# Patient Record
Sex: Female | Born: 1955 | Race: White | Hispanic: No | Smoking: Never smoker
Health system: Southern US, Community
[De-identification: ages and names within clinical notes are randomized; demographics above are authoritative.]

## PROBLEM LIST (undated history)

## (undated) DIAGNOSIS — F329 Major depressive disorder, single episode, unspecified: Secondary | ICD-10-CM

## (undated) DIAGNOSIS — I10 Essential (primary) hypertension: Secondary | ICD-10-CM

## (undated) DIAGNOSIS — F32A Depression, unspecified: Secondary | ICD-10-CM

## (undated) DIAGNOSIS — J45909 Unspecified asthma, uncomplicated: Secondary | ICD-10-CM

## (undated) DIAGNOSIS — E119 Type 2 diabetes mellitus without complications: Secondary | ICD-10-CM

## (undated) DIAGNOSIS — E785 Hyperlipidemia, unspecified: Secondary | ICD-10-CM

## (undated) HISTORY — PX: URETER SURGERY: SHX823

## (undated) HISTORY — DX: Unspecified asthma, uncomplicated: J45.909

## (undated) HISTORY — DX: Essential (primary) hypertension: I10

## (undated) HISTORY — PX: APPENDECTOMY: SHX54

## (undated) HISTORY — DX: Hyperlipidemia, unspecified: E78.5

## (undated) HISTORY — PX: OTHER SURGICAL HISTORY: SHX169

## (undated) HISTORY — DX: Major depressive disorder, single episode, unspecified: F32.9

## (undated) HISTORY — DX: Type 2 diabetes mellitus without complications: E11.9

## (undated) HISTORY — PX: NASAL SINUS SURGERY: SHX719

## (undated) HISTORY — DX: Depression, unspecified: F32.A

---

## 1998-05-01 ENCOUNTER — Emergency Department (HOSPITAL_COMMUNITY): Admission: EM | Admit: 1998-05-01 | Discharge: 1998-05-01 | Payer: Self-pay | Admitting: Emergency Medicine

## 1999-02-12 ENCOUNTER — Inpatient Hospital Stay (HOSPITAL_COMMUNITY): Admission: EM | Admit: 1999-02-12 | Discharge: 1999-02-21 | Payer: Self-pay | Admitting: Emergency Medicine

## 1999-03-09 ENCOUNTER — Other Ambulatory Visit: Admission: RE | Admit: 1999-03-09 | Discharge: 1999-03-09 | Payer: Self-pay | Admitting: Obstetrics and Gynecology

## 1999-05-28 ENCOUNTER — Other Ambulatory Visit: Admission: RE | Admit: 1999-05-28 | Discharge: 1999-06-05 | Payer: Self-pay

## 2001-01-14 ENCOUNTER — Inpatient Hospital Stay (HOSPITAL_COMMUNITY): Admission: RE | Admit: 2001-01-14 | Discharge: 2001-01-16 | Payer: Self-pay | Admitting: Obstetrics and Gynecology

## 2001-06-24 ENCOUNTER — Other Ambulatory Visit: Admission: RE | Admit: 2001-06-24 | Discharge: 2001-06-24 | Payer: Self-pay | Admitting: Obstetrics and Gynecology

## 2002-06-25 ENCOUNTER — Other Ambulatory Visit: Admission: RE | Admit: 2002-06-25 | Discharge: 2002-06-25 | Payer: Self-pay | Admitting: Obstetrics and Gynecology

## 2003-06-28 ENCOUNTER — Other Ambulatory Visit: Admission: RE | Admit: 2003-06-28 | Discharge: 2003-06-28 | Payer: Self-pay | Admitting: Obstetrics and Gynecology

## 2003-11-17 ENCOUNTER — Other Ambulatory Visit: Admission: RE | Admit: 2003-11-17 | Discharge: 2003-11-17 | Payer: Self-pay | Admitting: Obstetrics and Gynecology

## 2004-04-26 ENCOUNTER — Encounter: Admission: RE | Admit: 2004-04-26 | Discharge: 2004-04-26 | Payer: Self-pay | Admitting: Ophthalmology

## 2004-04-26 IMAGING — CT CT HEAD WO/W CM
4 of 6 series · 16 of 30 positions shown, 17 images · IV contrast (OMNIPAQUE [ID])
Comparison: none

CLINICAL DATA: Eye pain.  
CT HEAD WITHOUT and with CONTRAST
No previous for comparison.  
Cranial CT was performed before and after administration of  75 cc Omnipaque 300 intravenous contrast.  
There is no evidence of enhancing lesions, brain edema, mass effect or intracranial hemorrhage.  The ventricles are normal.  No extra-axial abnormalities are identified.  Bone windows show no significant abnormality. 
IMPRESSION
Negative cranial CT.    
CT ORBITS
Direct axial and coronal scanning was performed after 75 ml Omnipaque 300 IV.

[Series 2: brain · axial · 0.49mm/px · z∈[+46,+165]mm · 3 of 24 slices shown, 4 images]
[im 1/24  brain]
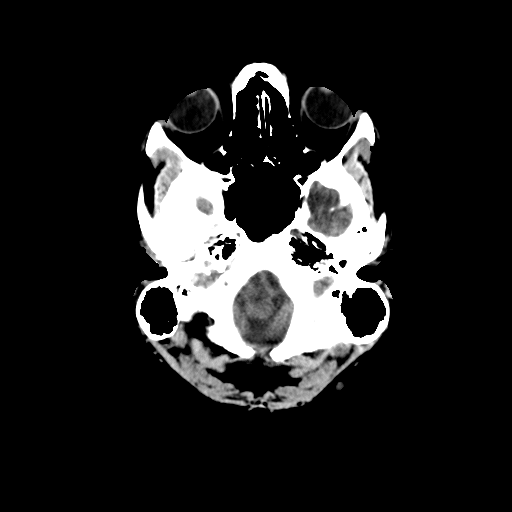
[im 1/24  bone]
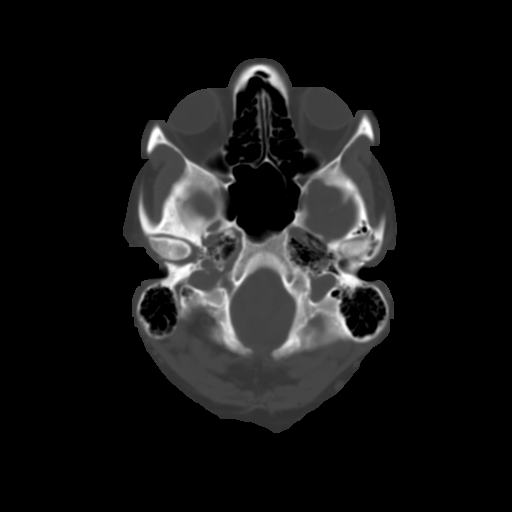
[im 12/24  brain]
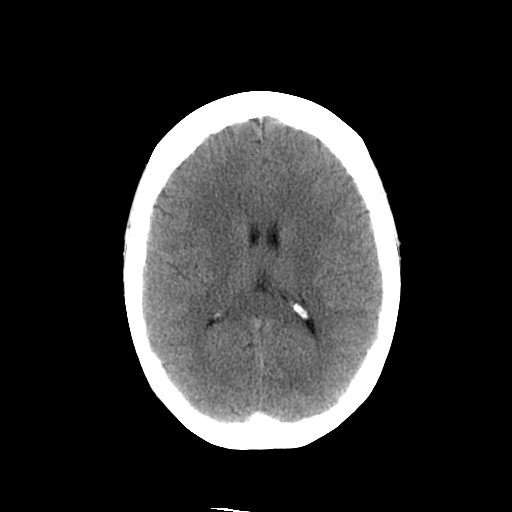
[im 24/24  brain]
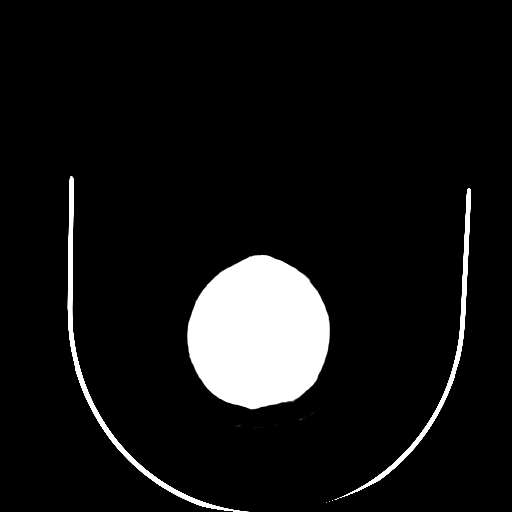

[Series 4: orbits/facial prone · axial · 0.33mm/px · z∈[+47,+87]mm · 3 of 32 slices shown]
[im 8/32  brain]
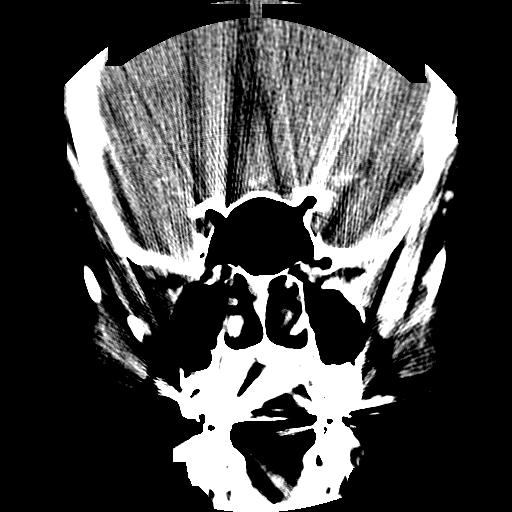
[im 16/32  brain]
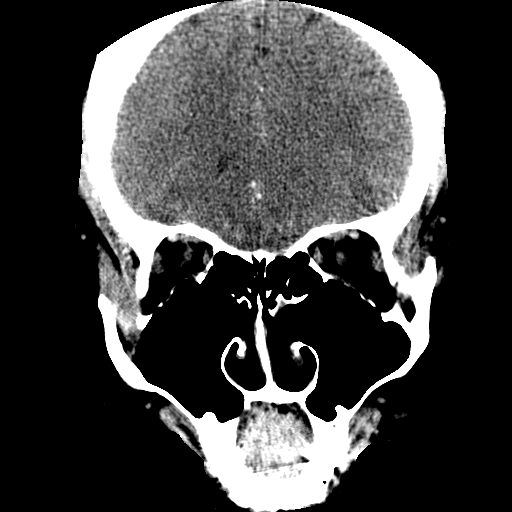
[im 24/32  brain]
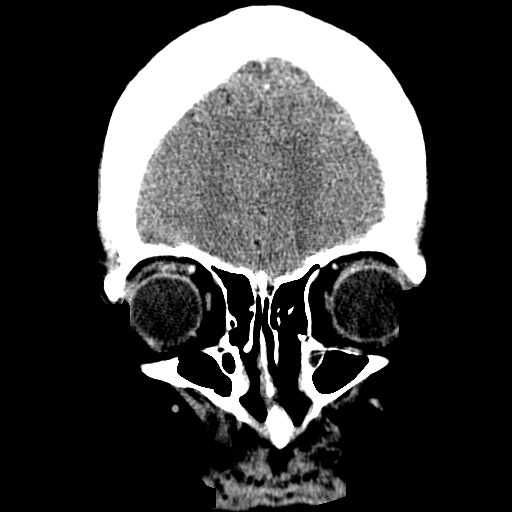

[Series 7: — · axial · 0.33mm/px · z∈[-15,+25]mm · 5 of 50 slices shown]
[im 9/50  brain]
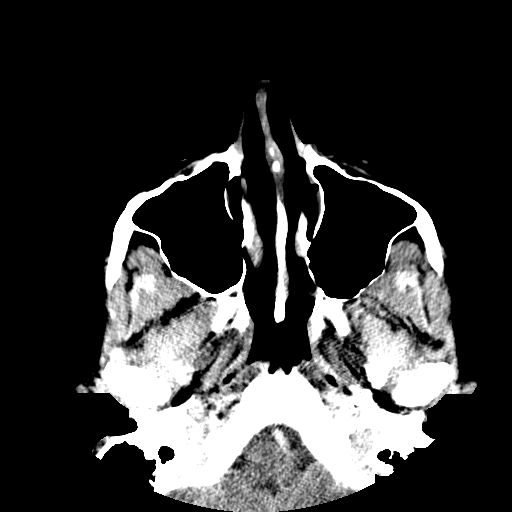
[im 17/50  brain]
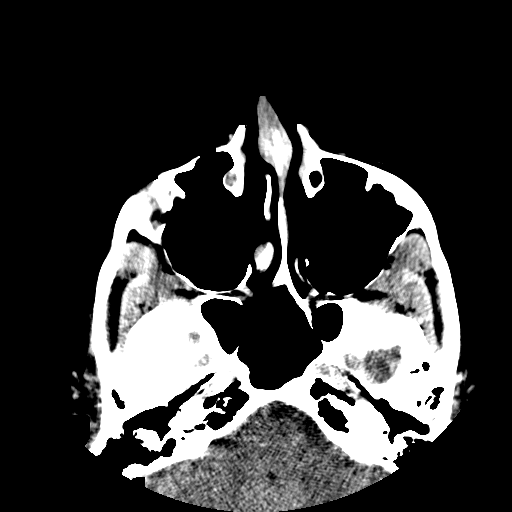
[im 25/50  brain]
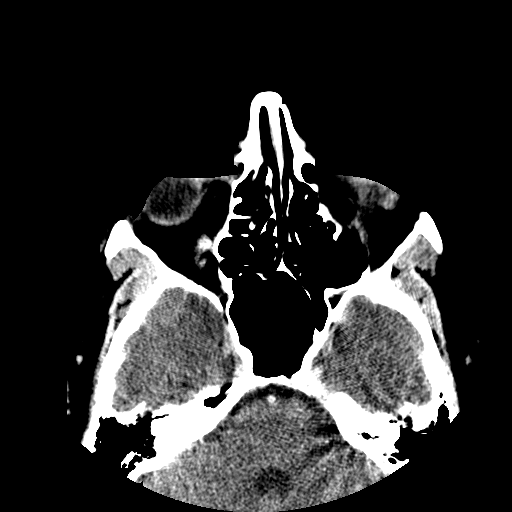
[im 33/50  brain]
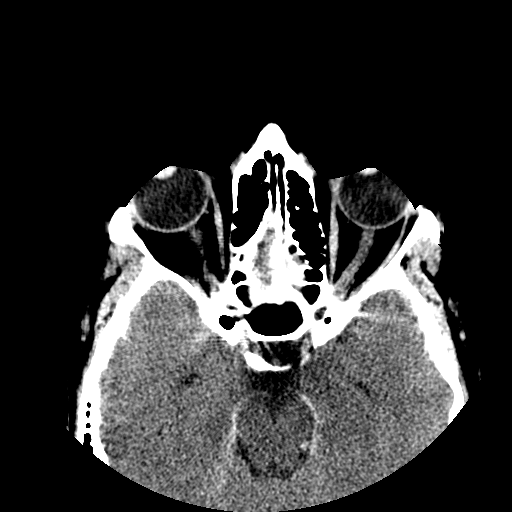
[im 41/50  brain]
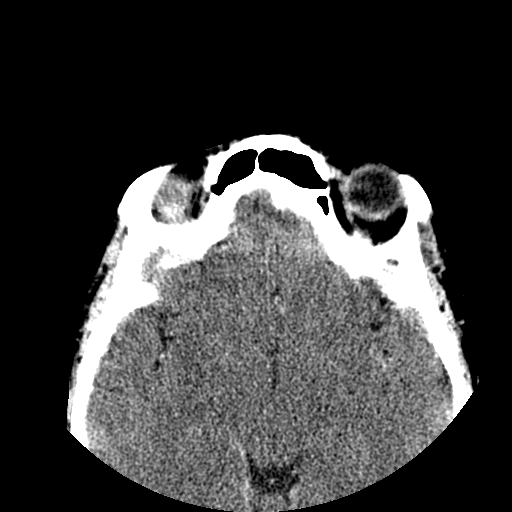

[Series 8: recon 2: · axial · 0.33mm/px · z∈[-15,+25]mm · 5 of 50 slices shown]
[im 9/50  brain]
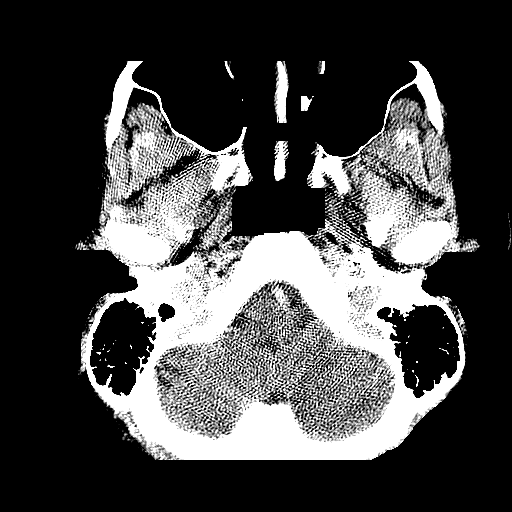
[im 17/50  brain]
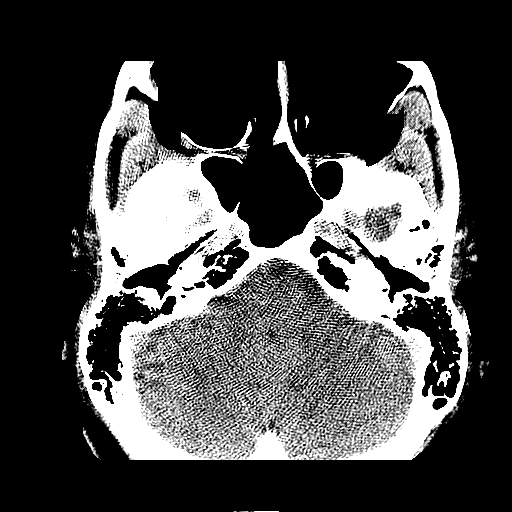
[im 25/50  brain]
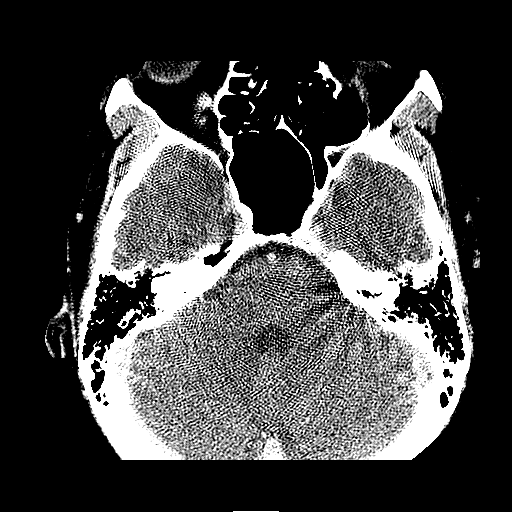
[im 33/50  brain]
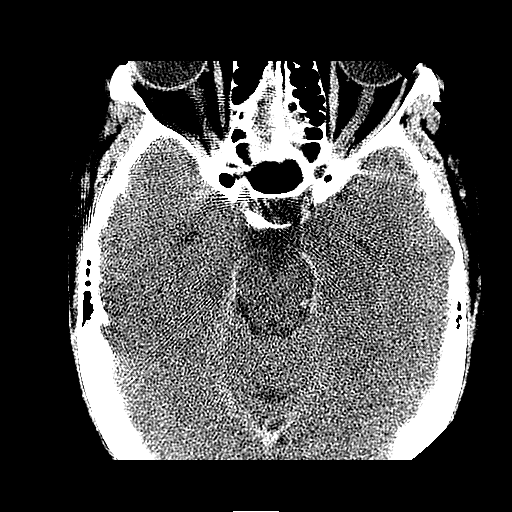
[im 41/50  brain]
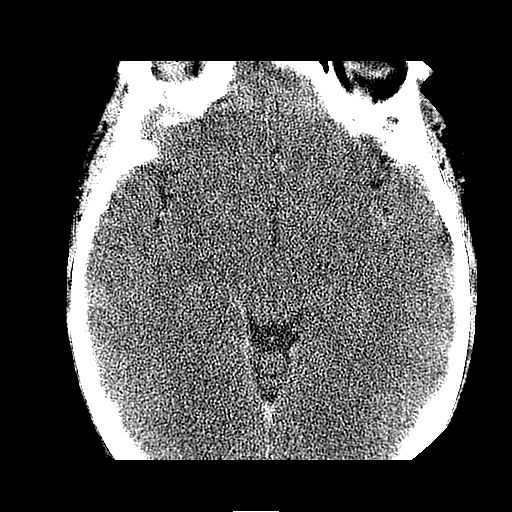

[16 of 30 positions shown; findings below may reference images not displayed]

FINDINGS: No evidence of preseptal or intraorbital pathology.  No fluid collections or inflammatory processes are identified.  Paranasal sinuses appear normally developed and well aerated.  Dental restorations are incidentally noted.  
IMPRESSION
Negative.

## 2004-05-24 ENCOUNTER — Ambulatory Visit (HOSPITAL_COMMUNITY): Admission: RE | Admit: 2004-05-24 | Discharge: 2004-05-24 | Payer: Self-pay | Admitting: Ophthalmology

## 2004-06-29 ENCOUNTER — Other Ambulatory Visit: Admission: RE | Admit: 2004-06-29 | Discharge: 2004-06-29 | Payer: Self-pay | Admitting: Obstetrics and Gynecology

## 2004-10-10 ENCOUNTER — Encounter: Admission: RE | Admit: 2004-10-10 | Discharge: 2004-10-10 | Payer: Self-pay

## 2004-10-12 ENCOUNTER — Encounter: Admission: RE | Admit: 2004-10-12 | Discharge: 2004-10-12 | Payer: Self-pay

## 2004-11-12 ENCOUNTER — Encounter: Admission: RE | Admit: 2004-11-12 | Discharge: 2005-02-10 | Payer: Self-pay | Admitting: Emergency Medicine

## 2004-12-25 ENCOUNTER — Other Ambulatory Visit: Admission: RE | Admit: 2004-12-25 | Discharge: 2004-12-25 | Payer: Self-pay | Admitting: Obstetrics and Gynecology

## 2005-07-15 ENCOUNTER — Other Ambulatory Visit: Admission: RE | Admit: 2005-07-15 | Discharge: 2005-07-15 | Payer: Self-pay | Admitting: Obstetrics and Gynecology

## 2008-03-08 ENCOUNTER — Encounter: Admission: RE | Admit: 2008-03-08 | Discharge: 2008-03-08 | Payer: Self-pay | Admitting: Emergency Medicine

## 2008-09-11 ENCOUNTER — Emergency Department (HOSPITAL_COMMUNITY): Admission: EM | Admit: 2008-09-11 | Discharge: 2008-09-11 | Payer: Self-pay | Admitting: Emergency Medicine

## 2009-09-26 ENCOUNTER — Encounter: Admission: RE | Admit: 2009-09-26 | Discharge: 2009-09-26 | Payer: Self-pay | Admitting: Emergency Medicine

## 2010-08-24 ENCOUNTER — Ambulatory Visit (HOSPITAL_COMMUNITY): Admission: RE | Admit: 2010-08-24 | Discharge: 2010-08-24 | Payer: Self-pay | Admitting: Obstetrics and Gynecology

## 2011-01-16 LAB — GLUCOSE, CAPILLARY
Glucose-Capillary: 150 mg/dL — ABNORMAL HIGH (ref 70–99)
Glucose-Capillary: 155 mg/dL — ABNORMAL HIGH (ref 70–99)

## 2011-01-16 LAB — PREGNANCY, URINE: Preg Test, Ur: NEGATIVE

## 2011-01-17 LAB — BASIC METABOLIC PANEL
BUN: 10 mg/dL (ref 6–23)
CO2: 23 mEq/L (ref 19–32)
Calcium: 9 mg/dL (ref 8.4–10.5)
Chloride: 104 mEq/L (ref 96–112)
Creatinine, Ser: 0.61 mg/dL (ref 0.4–1.2)
GFR calc Af Amer: >60 mL/min (ref >60)
GFR calc non Af Amer: >60 mL/min (ref >60)
Glucose, Bld: 140 mg/dL — ABNORMAL HIGH (ref 70–99)
Potassium: 3.9 mEq/L (ref 3.5–5.1)
Sodium: 135 mEq/L (ref 135–145)

## 2011-01-17 LAB — CBC
HCT: 36 % (ref 36.0–46.0)
Hemoglobin: 12 g/dL (ref 12.0–15.0)
MCH: 27.7 pg (ref 26.0–34.0)
MCHC: 33.3 g/dL (ref 30.0–36.0)
MCV: 83.2 fL (ref 78.0–100.0)
Platelets: 324 10*3/uL (ref 150–400)
RBC: 4.32 MIL/uL (ref 3.87–5.11)
RDW: 16.2 % — ABNORMAL HIGH (ref 11.5–15.5)
WBC: 7.9 10*3/uL (ref 4.0–10.5)

## 2011-01-17 LAB — PROTIME-INR
INR: 0.99 (ref 0.00–1.49)
Prothrombin Time: 13.3 seconds (ref 11.6–15.2)

## 2011-01-17 LAB — APTT: aPTT: 33 seconds (ref 24–37)

## 2011-03-22 NOTE — H&P (Signed)
[facility]  Patient:    Lauren Ochoa                    MRN: [id number] Adm. Date:  01/14/01 Attending:  Gaetano Hawthorne. Lily Peer, M.D.                         History and Physical  CHIEF COMPLAINT:              1. Term intrauterine pregnancy.                               2. Geographical distance from the hospital.  HISTORY:                      The patient is a 55 year old gravida 2, para 1, whose corrected estimated date of confinement was January 17, 2001.  The patient patient will be approaching [redacted] weeks gestation at the time of admission to [facility] on January 14, 2001, for induction.  The patients prenatal course is significant for the fact that she had RhoGAM administered at [redacted] weeks gestation.  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  She also had positive group-B Streptococcus culture last pregnancy, for which we will treat in labor with this pregnancy, and she was also cultured in this pregnancy as well, and was positive as well.  ALLERGIES:                    Denied.  PAST MEDICAL HISTORY:         A previous normal spontaneous vaginal delivery in 1999.  Subsequently developed HELP syndrome.  She also had a positive GBS culture last pregnancy.  REVIEW OF SYSTEMS:            See the Hollister form.  PHYSICAL EXAMINATION:  VITAL SIGNS:                  Blood pressure 130/70.  Urine negative for protein or glucose.  Weight 161 pounds.  HEENT:                        Unremarkable.  NECK:                         Supple, trachea midline.  No carotid bruits, no thyromegaly.  LUNGS:                        Clear to auscultation without rhonchi or wheezes.  HEART:                        A regular rate and rhythm.  No murmurs or gallops.  BREASTS:                      Done during the first trimester prenatal visit and was reported to be normal.  ABDOMEN:                       Gravid uterus, fundal height 38 cm, vertex presentation by Thayer Ohm maneuver.  Positive fetal heart tones.  PELVIC:  Cervix was 1.0 cm dilated, 80% effaced, ballotable.  EXTREMITIES:                  Deep tendon reflexes 1+, negative clonus.  PRENATAL LABORATORY DATA:     A-negative blood type, negative antibody screen. VDRL nonreactive.  Rubella immune.  Hepatitis-B surface antigen and HIV were negative.  Alpha fetoprotein normal.  Blood sugar normal.  Positive GBS culture.  ASSESSMENT:                   A 55 year old gravida 2, para 1, at 39-1/2 weeks                               estimated gestational age, with a history                               of short labor with last pregnancy.  The                               patient was in labor for four hours.  They                               live far from [place], and it was decided                               to bring them in for induction electively                               for safer route of delivery and timing.  PLAN:                         The patient will be admitted in the morning on January 14, 2001, at 6 a.m., where she will be started on Pitocin high dose, and also for GBS prophylaxis.  She will be treated with penicillin G 5 million units initially, followed by 2.5 million units IV q.4h.  The risks, benefits, pros, and cons of induction were discussed with the patient.  All questions were answered, and we will follow accordingly.  DD:  01/13/01 TD:  01/13/01 Job: [id number]

## 2011-03-22 NOTE — Op Note (Signed)
[facility]  Patient:    Lauren Ochoa, Lauren Ochoa                   MRN: [id number] Proc. Date: 01/14/01 Adm. Date:  [id number] Attending:  Conley Simmonds A Dictator:   BAS                           Operative Report  PREOPERATIVE DIAGNOSIS:  Genuine stress incontinence.  POSTOPERATIVE DIAGNOSIS:  Genuine stress incontinence.  PROCEDURES:  Abdominal Charletta Cousin, cystoscopy, suprapubic catheter placement.  ANESTHESIA:  General endotracheal.  IV FLUIDS:  2100 cc Ringers lactate.  ESTIMATED BLOOD LOSS:  75 cc.  URINE OUTPUT:  300 cc.  COMPLICATIONS:  None.  INDICATIONS FOR PROCEDURE:  The patient is a 55 year old para 2 Caucasian female with a history of two prior cesarean sections and genuine stress incontinence since the birth of her second child, who wished for a surgical repair of her incontinence.  The patient did have preoperative multichannel urodynamic testing, which confirmed the presence of genuine stress incontinence with high leak point pressures.  The cystometrogram was stable, and there was no evidence of any voiding dysfunction.  The patient requested a surgical evaluation and treatment of the incontinence, and she chose to proceed with an abdominal Burch procedure after the risks, benefits, and alternatives were discussed with her.  FINDINGS:  Cystoscopy at the time of completion of the Burch procedure demonstrated the urethra and the bladder to be without evidence of sutures. The bladder was visualized throughout 360 degrees.  Both of the ureters were noted to be patent bilaterally.  SPECIMENS:  None.  DESCRIPTION OF PROCEDURE:  After the patient was properly identified, she was escorted to the operating room suite.  She was placed in a supine position on the operating table, and general endotracheal anesthesia was induced.  The patient was then placed in the dorsal lithotomy position.  The abdomen and the vagina were sterilely prepped and  draped, and a Foley catheter was sterilely placed inside the urinary bladder.  The patient was placed in the Trendelenburg position, and a Pfannenstiel incision was created in the skin at the level of the patients prior cesarean section incision.  This was created sharply with a scalpel, and it was carried down to the fascia using monopolar cautery.  The fascia was then split in the midline with a scalpel, and the incision was carried out bilaterally using a Mayo scissors.  The rectus fascia was separated from the underlying muscles using sharp dissection with a Mayo scissors inferiorly.  A Cherney modification was then created in the incision, and the tendinous insertions of the rectus muscles were dissected off of the suprapubic insertion sites bilaterally using monopolar cautery.  The space of Retzius was identified, and with a gloved hand sterilely placed inside the vagina, a dissection of the space of Retzius to visualize the arcus tendineus fascia of the pelvis bilaterally was performed.  Next, the Burch sutures were placed.  Two Burch sutures were placed on each side.  A double-armed 0 Ethibond suture was placed in a figure-of-eight fashion through the paravaginal tissue at the level of the midurethra and just lateral to it on the patients right-hand side.  A second suture was placed just laterally to this at the level of the urethrovesical junction.  The same procedure that was performed on the patients right-hand side was then repeated on the patients left side.  Each arm of the  suture was brought up through the Coopers ligament on the ipsilateral side, and the sutures were tied while the gloved hand from below elevated the paravaginal tissue.  Cystoscopy was then performed, and the findings are as noted above.  A suprapubic catheter was placed under direct visualization of the cystoscope, and it was secured to the skin using interrupted sutures of 2-0 nylon.  The abdomen was  next closed.  PDS 3-0 suture was used in a figure-of-eight fashion to replace the rectus muscles along the fascial insertion sites bilaterally. The fascia was then closed in a running fashion with 0 PDS.  The subcutaneous tissue was irrigated, and small bleeding vessels were cauterized with monopolar cautery.  Interrupted sutures of 3-0 plain were placed in a subcutaneous layer, and the skin was closed with staples.  A sterile bandage was placed over the incision site.  There were no complications to the procedure.  All needle, instrument, and sponge counts were correct.  The catheters were each placed to gravity drainage. DD:  01/14/01 TD:  01/15/01 Job: [id number]

## 2011-03-22 NOTE — Discharge Summary (Signed)
[facility]  Patient:    Lauren Ochoa, Lauren Ochoa                   MRN: [id number] Adm. Date:  [id number] Disc. Date: 01/16/01 Attending:  Conley Simmonds A                           Discharge Summary  ADMISSION DIAGNOSES: 1. Genuine stress incontinence. 2. Recent urinary tract infection.  DISCHARGE DIAGNOSES: 1. Genuine stress incontinence. 2. Status post abdominal Birch procedure, cystoscopy, suprapubic catheter    placement. 3. Low-grade postoperative temperature.  PROCEDURE:  Abdominal Charletta Cousin procedure with cystoscopy and suprapubic catheter placement was performed on January 14, 2001, under the direction of Dr. Conley Simmonds at [facility].  HISTORY OF PRESENT ILLNESS:  The patient is a 55 year old, G6, P2-0-4-2, Caucasian female who had a history of urinary incontinence for several years duration who had leakage of urine with coughing, sneezing, exercise and intercourse.  The patient wished for surgical evaluation and treatment.  Exam showed first-degree cystocele with good uterine and posterior vaginal support.  The uterus was small, in mid position, mobile and nontender.  No adnexa masses or tenderness was appreciated.  Preoperative multichannel urodynamic testing confirmed the presence of genuine stress incontinence with high leak pressures.  The CMG was stable and the patient was noted to have adequate voiding with no significant postvoid residual.  The patient had a preoperative urinalysis which documented 50+ wbcs, 50+ rbcs and 1+ bacteria.  The patient was given a diagnosis of genuine stress incontinence with a urinary tract infection and she was started on ciprofloxacin 500 mg p.o. b.i.d. for approximately three days prior to her surgery.  HOSPITAL COURSE:  The patient was admitted on January 14, 2001, at which time she underwent an abdominal Charletta Cousin procedure with cystoscopy and suprapubic catheter placement while under general  anesthesia.  The estimated blood loss from surgery was 75 cc and there were no complications.  Cystoscopy documented the absence of sutures in the bladder and the urethra and ureters were noted to be patent bilaterally.  Postoperatively, the patients hospital course was quite unremarkable.  The patient had good pain control after her surgery using a morphine PCA and Toradol which was successfully converted to oral pain medication with Percocet and Motrin.  The patients diet was advanced to normal and she was tolerating this well.  The patient was able to ambulate independently.  The patient did undergo bladder training postoperatively and she was able to spontaneously void with residuals which measured greater than one-third of the total volume voided.  The incision demonstrated no evidence of erythema or drainage.  The patient did have a low-grade temperature to 100.2 the evening prior to her discharge and the next morning, it was noted to be 97.8.  The patient was determined to be ready for discharge on January 16, 2001.  She was discharged to home in good condition.  DISCHARGE MEDICATIONS: 1. Macrobid 100 mg p.o. b.i.d. x 7 days. 2. Prescription for Percocet one to two p.o. q.4-6h. p.r.n. 3. Ibuprofen 600 mg over-the-counter pain medication q.6h. p.r.n. 4. Iron sulfate 325 mg p.o. q.d.  DIET:  Regular.  ACTIVITY:  Decreased activity for six weeks.  FOLLOWUP:  Follow up in the office in three days for staple removal and potential suprapubic catheter removal.  SPECIAL INSTRUCTIONS:  She will call if she experiences increased temperature, increased pain, drainage or redness around her incision,  nausea, vomiting or any other concern. DD:  01/16/01 TD:  01/16/01 Job: [id number]

## 2011-06-27 ENCOUNTER — Other Ambulatory Visit: Payer: Self-pay | Admitting: Family Medicine

## 2011-06-27 ENCOUNTER — Ambulatory Visit
Admission: RE | Admit: 2011-06-27 | Discharge: 2011-06-27 | Disposition: A | Discharge status: Home / Self Care | Payer: BC Managed Care – PPO | Source: Ambulatory Visit | Attending: Family Medicine | Admitting: Family Medicine

## 2011-06-27 DIAGNOSIS — M25539 Pain in unspecified wrist: Secondary | ICD-10-CM

## 2011-06-27 DIAGNOSIS — R52 Pain, unspecified: Secondary | ICD-10-CM

## 2011-08-06 LAB — GLUCOSE, CAPILLARY: Glucose-Capillary: 116 — ABNORMAL HIGH

## 2011-08-14 ENCOUNTER — Other Ambulatory Visit: Payer: Self-pay | Admitting: Obstetrics and Gynecology

## 2012-07-27 ENCOUNTER — Ambulatory Visit
Admission: RE | Admit: 2012-07-27 | Discharge: 2012-07-27 | Disposition: A | Discharge status: Home / Self Care | Payer: Self-pay | Source: Ambulatory Visit | Attending: Family Medicine | Admitting: Family Medicine

## 2012-07-27 ENCOUNTER — Other Ambulatory Visit: Payer: Self-pay | Admitting: Family Medicine

## 2012-07-27 DIAGNOSIS — M549 Dorsalgia, unspecified: Secondary | ICD-10-CM

## 2012-08-03 ENCOUNTER — Other Ambulatory Visit: Payer: Self-pay | Admitting: Family Medicine

## 2012-08-03 DIAGNOSIS — R200 Anesthesia of skin: Secondary | ICD-10-CM

## 2012-08-03 DIAGNOSIS — M549 Dorsalgia, unspecified: Secondary | ICD-10-CM

## 2012-08-06 ENCOUNTER — Ambulatory Visit
Admission: RE | Admit: 2012-08-06 | Discharge: 2012-08-06 | Disposition: A | Discharge status: Home / Self Care | Payer: BC Managed Care – PPO | Source: Ambulatory Visit | Attending: Family Medicine | Admitting: Family Medicine

## 2012-08-06 DIAGNOSIS — M549 Dorsalgia, unspecified: Secondary | ICD-10-CM

## 2012-08-06 DIAGNOSIS — R200 Anesthesia of skin: Secondary | ICD-10-CM

## 2012-08-18 ENCOUNTER — Ambulatory Visit: Payer: BC Managed Care – PPO | Attending: Family Medicine | Admitting: Physical Therapy

## 2012-08-18 DIAGNOSIS — M2569 Stiffness of other specified joint, not elsewhere classified: Secondary | ICD-10-CM | POA: Insufficient documentation

## 2012-08-18 DIAGNOSIS — M545 Low back pain, unspecified: Secondary | ICD-10-CM | POA: Insufficient documentation

## 2012-08-18 DIAGNOSIS — IMO0001 Reserved for inherently not codable concepts without codable children: Secondary | ICD-10-CM | POA: Insufficient documentation

## 2012-08-19 ENCOUNTER — Ambulatory Visit: Payer: BC Managed Care – PPO | Admitting: Physical Therapy

## 2012-08-19 ENCOUNTER — Other Ambulatory Visit: Payer: Self-pay | Admitting: Obstetrics and Gynecology

## 2012-08-25 ENCOUNTER — Ambulatory Visit: Payer: BC Managed Care – PPO

## 2012-08-28 ENCOUNTER — Ambulatory Visit: Payer: BC Managed Care – PPO

## 2012-09-01 ENCOUNTER — Ambulatory Visit: Payer: BC Managed Care – PPO | Admitting: Physical Therapy

## 2012-09-04 ENCOUNTER — Ambulatory Visit: Payer: BC Managed Care – PPO | Attending: Family Medicine

## 2012-09-04 DIAGNOSIS — M545 Low back pain, unspecified: Secondary | ICD-10-CM | POA: Insufficient documentation

## 2012-09-04 DIAGNOSIS — M2569 Stiffness of other specified joint, not elsewhere classified: Secondary | ICD-10-CM | POA: Insufficient documentation

## 2012-09-04 DIAGNOSIS — IMO0001 Reserved for inherently not codable concepts without codable children: Secondary | ICD-10-CM | POA: Insufficient documentation

## 2012-09-08 ENCOUNTER — Ambulatory Visit: Payer: BC Managed Care – PPO | Admitting: Physical Therapy

## 2012-09-10 ENCOUNTER — Ambulatory Visit: Payer: BC Managed Care – PPO

## 2012-09-15 ENCOUNTER — Encounter: Payer: BC Managed Care – PPO | Admitting: Physical Therapy

## 2012-09-22 ENCOUNTER — Encounter: Payer: BC Managed Care – PPO | Admitting: Physical Therapy

## 2013-05-12 ENCOUNTER — Encounter (INDEPENDENT_AMBULATORY_CARE_PROVIDER_SITE_OTHER): Payer: BC Managed Care – PPO

## 2013-05-12 DIAGNOSIS — M79609 Pain in unspecified limb: Secondary | ICD-10-CM

## 2013-05-12 DIAGNOSIS — R609 Edema, unspecified: Secondary | ICD-10-CM

## 2013-05-12 DIAGNOSIS — M7989 Other specified soft tissue disorders: Secondary | ICD-10-CM

## 2013-07-16 ENCOUNTER — Ambulatory Visit (INDEPENDENT_AMBULATORY_CARE_PROVIDER_SITE_OTHER): Payer: BC Managed Care – PPO | Admitting: Family Medicine

## 2013-07-16 VITALS — BP 142/80 | HR 83 | Temp 98.8°F | Resp 18 | Ht 63.0 in | Wt 183.0 lb

## 2013-07-16 DIAGNOSIS — Z23 Encounter for immunization: Secondary | ICD-10-CM

## 2013-07-16 DIAGNOSIS — M25559 Pain in unspecified hip: Secondary | ICD-10-CM

## 2013-07-16 DIAGNOSIS — T148XXA Other injury of unspecified body region, initial encounter: Secondary | ICD-10-CM

## 2013-07-16 DIAGNOSIS — M25551 Pain in right hip: Secondary | ICD-10-CM

## 2013-07-16 DIAGNOSIS — IMO0002 Reserved for concepts with insufficient information to code with codable children: Secondary | ICD-10-CM

## 2013-07-16 MED ORDER — AMOXICILLIN-POT CLAVULANATE 875-125 MG PO TABS: 1.0000 | ORAL_TABLET | Freq: Two times a day (BID) | ORAL | Status: DC

## 2013-07-16 NOTE — Progress Notes (Signed)
Patient ID: PATINA SPANIER MRN: [id number], DOB: 12-Jan-1956, 57 y.o. Date of Encounter: 07/16/2013, 3:12 PM   PROCEDURE NOTE: Verbal consent obtained. Sterile technique employed. Numbing: Anesthesia obtained with 2% lidocaine with epinephrine.    Cleansed with soap and water. Irrigated.  Wound explored, no deep structures involved, no foreign bodies.   Wound repaired with # 3 SI using 5-0 ethilon.   Hemostasis obtained. Wound cleansed and dressed.  Wound care instructions including precautions covered with patient. Handout given.  Anticipate suture removal in 7 days.   Rhoderick Moody, PA-C 07/16/2013 3:12 PM

## 2013-07-16 NOTE — Progress Notes (Signed)
[facility]:  Office Visit  Chief Complaint:  Chief Complaint  Patient presents with  . injury to right thigh today    HPI: Lauren Ochoa is a 57 y.o. female who complains of  Right thigh pain s/p after fixing tire, nut bolt slipped and hit her but she did not think anything of it until she went home. Changed her jeans and found she had blood,wound and bruise. She has pain. She is not UTD on tetanus. She has diabetes, well controlled HbA1c 6.6.   Past Medical History  Diagnosis Date  . Depression   . Asthma   . Diabetes mellitus without complication   . Hyperlipidemia   . Hypertension    Past Surgical History  Procedure Laterality Date  . Appendectomy    . Cesarean section    . Ureter surgery    . Nasal sinus surgery    . Knee scopes     History   Social History  . Marital Status: Divorced    Spouse Name: N/A    Number of Children: N/A  . Years of Education: N/A   Social History Main Topics  . Smoking status: Never Smoker   . Smokeless tobacco: None  . Alcohol Use: Yes  . Drug Use: None  . Sexual Activity: None   Other Topics Concern  . None   Social History Narrative  . None   Family History  Problem Relation Age of Onset  . Cancer Mother   . Arthritis Sister   . Arthritis Maternal Grandmother   . Stroke Maternal Grandmother   . Cancer Maternal Grandfather   . Stroke Paternal Grandmother   . Cancer Paternal Grandfather    Allergies  Allergen Reactions  . Oxycodone Itching  . Percocet [Oxycodone-Acetaminophen] Itching   Prior to Admission medications   Medication Sig Start Date End Date Taking? Authorizing Provider  amLODipine (NORVASC) 2.5 MG tablet Take 2.5 mg by mouth daily.   Yes Historical Provider, MD  aspirin 81 MG tablet Take 81 mg by mouth daily.   Yes Historical Provider, MD  Atorvastatin Calcium (LIPITOR PO) Take by mouth.   Yes Historical Provider, MD  b complex vitamins tablet Take 1 tablet by mouth daily.   Yes  Historical Provider, MD  buPROPion (WELLBUTRIN XL) 150 MG 24 hr tablet Take 150 mg by mouth daily.   Yes Historical Provider, MD  Calcium Carbonate (CALCIUM 600 PO) Take by mouth.   Yes Historical Provider, MD  co-enzyme Q-10 30 MG capsule Take 30 mg by mouth 3 (three) times daily.   Yes Historical Provider, MD  escitalopram (LEXAPRO) 10 MG tablet Take 10 mg by mouth daily.   Yes Historical Provider, MD  fish oil-omega-3 fatty acids 1000 MG capsule Take 2 g by mouth daily.   Yes Historical Provider, MD  lisinopril-hydrochlorothiazide (PRINZIDE,ZESTORETIC) 20-25 MG per tablet Take 1 tablet by mouth daily.   Yes Historical Provider, MD  magnesium 30 MG tablet Take 30 mg by mouth 2 (two) times daily.   Yes Historical Provider, MD  Multiple Vitamin (MULTIVITAMIN) capsule Take 1 capsule by mouth daily.   Yes Historical Provider, MD  traZODone (DESYREL) 100 MG tablet Take 100 mg by mouth at bedtime.   Yes Historical Provider, MD     ROS: The patient denies fevers, chills, night sweats, unintentional weight loss, chest pain, palpitations, wheezing, dyspnea on exertion, nausea, vomiting, abdominal pain, dysuria, hematuria, melena, numbness, weakness, or tingling.   All other systems have  been reviewed and were otherwise negative with the exception of those mentioned in the HPI and as above.    PHYSICAL EXAM: Filed Vitals:   07/16/13 1333  BP: 142/80  Pulse: 83  Temp: 98.8 F (37.1 C)  Resp: 18   Filed Vitals:   07/16/13 1333  Height: 5\' 3"  (1.6 m)  Weight: 183 lb (83.008 kg)   Body mass index is 32.43 kg/(m^2).  General: Alert, no acute distress HEENT:  Normocephalic, atraumatic, oropharynx patent. EOMI, PERRLA Cardiovascular:  Regular rate and rhythm, no rubs murmurs or gallops.  No Carotid bruits, radial pulse intact. No pedal edema.  Respiratory: Clear to auscultation bilaterally.  No wheezes, rales, or rhonchi.  No cyanosis, no use of accessory musculature GI: No organomegaly,  abdomen is soft and non-tender, positive bowel sounds.  No masses. Skin: + 1 cm laceration deep on right inner thigh Neurologic: Facial musculature symmetric. Psychiatric: Patient is appropriate throughout our interaction. Lymphatic: No cervical lymphadenopathy Musculoskeletal: Gait intact.   LABS: Results for orders placed during the hospital encounter of 08/24/10  PREGNANCY, URINE      Result Value Range   Preg Test, Ur       Value: NEGATIVE            THE SENSITIVITY OF THIS     METHODOLOGY IS >24 mIU/mL  GLUCOSE, CAPILLARY      Result Value Range   Glucose-Capillary 155 (*) 70 - 99 mg/dL   Comment 1 Documented in Chart     Comment 2 Notify RN     Comment 3 Call MD NNP PA CNM    GLUCOSE, CAPILLARY      Result Value Range   Glucose-Capillary 150 (*) 70 - 99 mg/dL   Comment 1 Documented in Chart    BASIC METABOLIC PANEL      Result Value Range   Sodium 135  135 - 145 mEq/L   Potassium 3.9  3.5 - 5.1 mEq/L   Chloride 104  96 - 112 mEq/L   CO2 23  19 - 32 mEq/L   Glucose, Bld 140 (*) 70 - 99 mg/dL   BUN 10  6 - 23 mg/dL   Creatinine, Ser 4.78  0.4 - 1.2 mg/dL   Calcium 9.0  8.4 - 29.5 mg/dL   GFR calc non Af Amer >60  >60 mL/min   GFR calc Af Amer    >60 mL/min   Value: >60            The eGFR has been calculated     using the MDRD equation.     This calculation has not been     validated in all clinical     situations.     eGFR's persistently     <60 mL/min signify     possible Chronic Kidney Disease.  CBC      Result Value Range   WBC 7.9  4.0 - 10.5 K/uL   RBC 4.32  3.87 - 5.11 MIL/uL   Hemoglobin 12.0  12.0 - 15.0 g/dL   HCT 62.1  30.8 - 65.7 %   MCV 83.2  78.0 - 100.0 fL   MCH 27.7  26.0 - 34.0 pg   MCHC 33.3  30.0 - 36.0 g/dL   RDW 84.6 (*) 96.2 - 95.2 %   Platelets 324  150 - 400 K/uL  PROTIME-INR      Result Value Range   Prothrombin Time 13.3  11.6 - 15.2 seconds   INR  0.99  0.00 - 1.49  APTT      Result Value Range   aPTT 33  24 - 37 seconds      EKG/XRAY:   Primary read interpreted by Dr. Conley Rolls at [facility].   ASSESSMENT/PLAN: Encounter Diagnoses  Name Primary?  . Pain in joint, pelvic region and thigh, right Yes  . Laceration    Rx augmentin Tetanus given Stitches F/u in 7-10 days for stitch removal Gross sideeffects, risk and benefits, and alternatives of medications d/w patient. Patient is aware that all medications have potential sideeffects and we are unable to predict every sideeffect or drug-drug interaction that may occur.  Hamilton Capri PHUONG, DO 07/16/2013 3:36 PM

## 2013-07-19 ENCOUNTER — Ambulatory Visit: Payer: BC Managed Care – PPO

## 2013-07-19 ENCOUNTER — Ambulatory Visit (INDEPENDENT_AMBULATORY_CARE_PROVIDER_SITE_OTHER): Payer: BC Managed Care – PPO | Admitting: Internal Medicine

## 2013-07-19 VITALS — BP 124/76 | HR 90 | Temp 98.6°F | Resp 17 | Ht 63.0 in | Wt 186.2 lb

## 2013-07-19 DIAGNOSIS — I10 Essential (primary) hypertension: Secondary | ICD-10-CM

## 2013-07-19 DIAGNOSIS — E785 Hyperlipidemia, unspecified: Secondary | ICD-10-CM | POA: Insufficient documentation

## 2013-07-19 DIAGNOSIS — M79609 Pain in unspecified limb: Secondary | ICD-10-CM

## 2013-07-19 DIAGNOSIS — M79641 Pain in right hand: Secondary | ICD-10-CM

## 2013-07-19 NOTE — Progress Notes (Signed)
  Subjective:    Patient ID: Lauren Ochoa, female    DOB: May 02, 1956, 57 y.o.   MRN: [id number]  HPI Right hand with increased swelling and bruising in last 24 hours. This occurred with initial injury 3 days ago. Patient was changing a tire and tire iron slipped from her hand, cutting her right leg, requiring sutures. She did not realize she had hurt her hand until it started hurting and became bruised.  Weak in fingers.  Alleve 800 mg po- no relief. Put ice on it- no relief.  Review of Systems Sutures doing fine.    Objective:   Physical Exam WD/WN female in NAD, holding right hand.  Right radial pulse strong, hand warm, back of hand bruised, fingers 3,4 with decreased strength due to pain. Tender to palpation, esp index extensor. Minimal swelling eccy arm as well    [facility] reading (PRIMARY) by  Dr. Josephina Gip fx of metacarpals.   Assessment & Plan:  Right hand pain -contusion  Splint applied, patient to wear to comfort for 7-10 days. Can remove for showering. She will be following up in 4 days for suture removal. Can follow up pain then.  DG/RPD

## 2013-07-19 NOTE — Patient Instructions (Signed)
Wear splint as needed for comfort for 7-10 days. Remove for showering. Continue Alleve.

## 2013-07-23 ENCOUNTER — Ambulatory Visit (INDEPENDENT_AMBULATORY_CARE_PROVIDER_SITE_OTHER): Payer: BC Managed Care – PPO | Admitting: Physician Assistant

## 2013-07-23 VITALS — BP 142/88 | HR 81 | Temp 98.2°F | Resp 16 | Ht 63.0 in | Wt 186.0 lb

## 2013-07-23 DIAGNOSIS — S71101D Unspecified open wound, right thigh, subsequent encounter: Secondary | ICD-10-CM

## 2013-07-23 DIAGNOSIS — Z5189 Encounter for other specified aftercare: Secondary | ICD-10-CM

## 2013-07-23 NOTE — Progress Notes (Signed)
  Subjective:    Patient ID: Lauren Ochoa, female    DOB: [birth year], 57 y.o.   MRN: [id number]  HPI  This 57 y.o. female presents for evaluation of wound of the RIGHT lateral thigh, s/p wound closure with suture on 07/16/2013.  No bleeding, purulence, increased pain, redness, swelling.  No fever.  She notes some soreness with walking, but it's above the wound, and is improving.   Review of Systems     Objective:   Physical Exam  BP 142/88  Pulse 81  Temp(Src) 98.2 F (36.8 C) (Oral)  Resp 16  Ht 5\' 3"  (1.6 m)  Wt 186 lb (84.369 kg)  BMI 32.96 kg/m2  SpO2 96% WDWNWF, A&O x 3. Wound is well healed.  No drainage.  No erythema or induration.  Ecchymosis appears to be resolving.   #3 SI Ethilon sutures removed without incident.      Assessment & Plan:  Open wound of thigh, right, subsequent encounter  Local wound care. RTC PRN.  Fernande Bras, PA-C Physician Assistant-Certified [facility]

## 2013-08-02 ENCOUNTER — Emergency Department (HOSPITAL_COMMUNITY): Payer: BC Managed Care – PPO

## 2013-08-02 ENCOUNTER — Encounter (HOSPITAL_COMMUNITY): Payer: Self-pay | Admitting: Emergency Medicine

## 2013-08-02 ENCOUNTER — Emergency Department (HOSPITAL_COMMUNITY)
Admission: EM | Admit: 2013-08-02 | Discharge: 2013-08-02 | Disposition: A | Discharge status: Home / Self Care | Payer: BC Managed Care – PPO | Attending: Emergency Medicine | Admitting: Emergency Medicine

## 2013-08-02 DIAGNOSIS — E119 Type 2 diabetes mellitus without complications: Secondary | ICD-10-CM | POA: Insufficient documentation

## 2013-08-02 DIAGNOSIS — R079 Chest pain, unspecified: Secondary | ICD-10-CM

## 2013-08-02 DIAGNOSIS — R110 Nausea: Secondary | ICD-10-CM | POA: Insufficient documentation

## 2013-08-02 DIAGNOSIS — Z79899 Other long term (current) drug therapy: Secondary | ICD-10-CM | POA: Insufficient documentation

## 2013-08-02 DIAGNOSIS — E785 Hyperlipidemia, unspecified: Secondary | ICD-10-CM | POA: Insufficient documentation

## 2013-08-02 DIAGNOSIS — Z7982 Long term (current) use of aspirin: Secondary | ICD-10-CM | POA: Insufficient documentation

## 2013-08-02 DIAGNOSIS — J45909 Unspecified asthma, uncomplicated: Secondary | ICD-10-CM | POA: Insufficient documentation

## 2013-08-02 DIAGNOSIS — I10 Essential (primary) hypertension: Secondary | ICD-10-CM | POA: Insufficient documentation

## 2013-08-02 DIAGNOSIS — F3289 Other specified depressive episodes: Secondary | ICD-10-CM | POA: Insufficient documentation

## 2013-08-02 DIAGNOSIS — F329 Major depressive disorder, single episode, unspecified: Secondary | ICD-10-CM | POA: Insufficient documentation

## 2013-08-02 LAB — COMPREHENSIVE METABOLIC PANEL
ALT: 23 U/L (ref 0–35)
AST: 24 U/L (ref 0–37)
CO2: 28 mEq/L (ref 19–32)
Calcium: 9.4 mg/dL (ref 8.4–10.5)
Chloride: 102 mEq/L (ref 96–112)
GFR calc non Af Amer: >90 mL/min (ref >90)
Sodium: 140 mEq/L (ref 135–145)

## 2013-08-02 LAB — CBC WITH DIFFERENTIAL/PLATELET
Basophils Absolute: 0 10*3/uL (ref 0.0–0.1)
Eosinophils Relative: 3 % (ref 0–5)
Lymphocytes Relative: 45 % (ref 12–46)
Neutro Abs: 3.4 10*3/uL (ref 1.7–7.7)
Neutrophils Relative %: 45 % (ref 43–77)
Platelets: 273 10*3/uL (ref 150–400)
RDW: 12.6 % (ref 11.5–15.5)
WBC: 7.5 10*3/uL (ref 4.0–10.5)

## 2013-08-02 LAB — POCT I-STAT TROPONIN I: Troponin i, poc: 0 ng/mL (ref 0.00–0.08)

## 2013-08-02 MED ORDER — DIPHENHYDRAMINE HCL 50 MG/ML IJ SOLN
25.0000 mg | Freq: Once | INTRAMUSCULAR | Status: AC
  Administered 2013-08-02: 25 mg via INTRAVENOUS
  Filled 2013-08-02: qty 1

## 2013-08-02 MED ORDER — GI COCKTAIL ~~LOC~~
30.0000 mL | Freq: Once | ORAL | Status: AC
  Administered 2013-08-02: 30 mL via ORAL
  Filled 2013-08-02: qty 30

## 2013-08-02 MED ORDER — MORPHINE SULFATE 4 MG/ML IJ SOLN
4.0000 mg | Freq: Once | INTRAMUSCULAR | Status: AC
  Administered 2013-08-02: 4 mg via INTRAVENOUS
  Filled 2013-08-02: qty 1

## 2013-08-02 MED ORDER — ONDANSETRON HCL 4 MG/2ML IJ SOLN
4.0000 mg | Freq: Once | INTRAMUSCULAR | Status: AC
  Administered 2013-08-02: 4 mg via INTRAVENOUS
  Filled 2013-08-02: qty 2

## 2013-08-02 MED ORDER — NITROGLYCERIN 0.4 MG SL SUBL
0.4000 mg | SUBLINGUAL_TABLET | SUBLINGUAL | Status: DC | PRN
  Administered 2013-08-02 (×2): 0.4 mg via SUBLINGUAL
  Filled 2013-08-02: qty 25

## 2013-08-02 NOTE — ED Notes (Signed)
EMS called out to residence for Pt. C/o chest pain that started at 2300 last pm. States it is located central chest goes to her back and down right arm. + for nausea somewhat resolved with zofran 4mg . IV. Nitro. 4mg . SL. Given and aspirin 324mg . Chest pain went from 8/10-7/10 after nitro.

## 2013-08-02 NOTE — ED Provider Notes (Signed)
CSN: [id number]     Arrival date & time 08/02/13  0056 History   First MD Initiated Contact with Patient 08/02/13 0056     Chief Complaint  Patient presents with  . Chest Pain   (Consider location/radiation/quality/duration/timing/severity/associated sxs/prior Treatment) The history is provided by the patient.  Lauren Ochoa is a 57 y.o. female history of diabetes, hypertension, hyperlipidemia here presenting with chest pain. Right-sided chest pain starting around 11 PM while laying down. It is constant and some does radiate down to the right arm as well as to her back. She felt nauseous but denies any diaphoresis. Denies any fevers or chills or abdominal pain. She was not a smoker and no history or family history of CAD. Given aspirin 325 mg and nitro x 1 by EMS.    Past Medical History  Diagnosis Date  . Depression   . Asthma   . Diabetes mellitus without complication   . Hyperlipidemia   . Hypertension    Past Surgical History  Procedure Laterality Date  . Appendectomy    . Cesarean section    . Ureter surgery    . Nasal sinus surgery    . Knee scopes     Family History  Problem Relation Age of Onset  . Cancer Mother   . Arthritis Sister   . Arthritis Maternal Grandmother   . Stroke Maternal Grandmother   . Cancer Maternal Grandfather   . Stroke Paternal Grandmother   . Cancer Paternal Grandfather    History  Substance Use Topics  . Smoking status: Never Smoker   . Smokeless tobacco: Not on file  . Alcohol Use: Yes   OB History   Grav Para Term Preterm Abortions TAB SAB Ect Mult Living                 Review of Systems  Cardiovascular: Positive for chest pain.  All other systems reviewed and are negative.    Allergies  Oxycodone and Percocet  Home Medications   Current Outpatient Rx  Name  Route  Sig  Dispense  Refill  . amLODipine (NORVASC) 2.5 MG tablet   Oral   Take 2.5 mg by mouth daily.         Marland Kitchen aspirin 81 MG tablet   Oral   Take 81 mg by  mouth daily.         Marland Kitchen atorvastatin (LIPITOR) 20 MG tablet   Oral   Take 20 mg by mouth daily.         Marland Kitchen b complex vitamins tablet   Oral   Take 1 tablet by mouth daily.         Marland Kitchen buPROPion (WELLBUTRIN XL) 150 MG 24 hr tablet   Oral   Take 150 mg by mouth daily.         . Calcium Carbonate (CALCIUM 600 PO)   Oral   Take 1 tablet by mouth daily.          . Coenzyme Q10 200 MG capsule   Oral   Take 200 mg by mouth at bedtime.         Marland Kitchen escitalopram (LEXAPRO) 10 MG tablet   Oral   Take 10 mg by mouth daily.         . fish oil-omega-3 fatty acids 1000 MG capsule   Oral   Take 2 g by mouth daily.         Marland Kitchen lisinopril-hydrochlorothiazide (PRINZIDE,ZESTORETIC) 20-25 MG per tablet   Oral  Take 1 tablet by mouth daily.         Marland Kitchen loratadine (CLARITIN) 10 MG tablet   Oral   Take 10 mg by mouth daily.         . Magnesium 500 MG TABS   Oral   Take 1 tablet by mouth daily.         . Multiple Vitamin (MULTIVITAMIN) capsule   Oral   Take 1 capsule by mouth daily.         . traZODone (DESYREL) 100 MG tablet   Oral   Take 100 mg by mouth at bedtime.         Marland Kitchen aspirin 81 MG chewable tablet   Oral   Chew 324 mg by mouth once.         . nitroGLYCERIN (NITROSTAT) 0.4 MG SL tablet   Sublingual   Place 0.4 mg under the tongue every 5 (five) minutes as needed for chest pain.          BP 130/76  Pulse 65  Temp(Src) 98.4 F (36.9 C) (Oral)  Resp 19  SpO2 96% Physical Exam  Nursing note and vitals reviewed. Constitutional: She is oriented to person, place, and time.  Slightly uncomfortable   HENT:  Head: Normocephalic.  Mouth/Throat: Oropharynx is clear and moist.  Eyes: Conjunctivae are normal. Pupils are equal, round, and reactive to light.  Neck: Normal range of motion. Neck supple.  Cardiovascular: Normal rate, regular rhythm and normal heart sounds.   Pulmonary/Chest: Effort normal and breath sounds normal. No respiratory distress.  She has no wheezes. She has no rales. She exhibits no tenderness.  Abdominal: Soft. Bowel sounds are normal. She exhibits no distension. There is no tenderness. There is no rebound and no guarding.  Musculoskeletal: Normal range of motion. She exhibits no edema and no tenderness.  Neurological: She is alert and oriented to person, place, and time.  Skin: Skin is warm and dry.  Psychiatric: She has a normal mood and affect. Her behavior is normal. Judgment and thought content normal.    ED Course  Procedures (including critical care time) Labs Review Labs Reviewed  CBC WITH DIFFERENTIAL - Abnormal; Notable for the following:    HCT 34.6 (*)    All other components within normal limits  COMPREHENSIVE METABOLIC PANEL - Abnormal; Notable for the following:    Glucose, Bld 139 (*)    Total Bilirubin 0.1 (*)    All other components within normal limits  POCT I-STAT TROPONIN I  POCT I-STAT TROPONIN I   Imaging Review Dg Chest 2 View  08/02/2013   CLINICAL DATA:  Chest pain, shortness of Breath.  EXAM: CHEST  2 VIEW  COMPARISON:  None.  FINDINGS: The heart size and mediastinal contours are within normal limits. Both lungs are clear. The visualized skeletal structures are unremarkable.  IMPRESSION: No active cardiopulmonary disease.   Electronically Signed   By: Charlett Nose M.D.   On: 08/02/2013 01:50    Date: 08/02/2013  Rate: 74  Rhythm: normal sinus rhythm  QRS Axis: normal  Intervals: normal  ST/T Wave abnormalities: normal  Conduction Disutrbances:none  Narrative Interpretation:   Old EKG Reviewed: none available    MDM  No diagnosis found. Lauren Ochoa is a 57 y.o. female here with R sided chest pain. Low to moderate risk for ACS. Will get trop x 2 and labs and cxr. Will reassess.   5:06 AM Trop neg x 2. CXR and labs unremarkable. Pain  improved with morphine. Recommend f/u with PMD for possible stress test. Return precautions given.      Richardean Canal, MD 08/02/13 [phone]

## 2013-09-23 ENCOUNTER — Other Ambulatory Visit: Payer: Self-pay | Admitting: Obstetrics and Gynecology

## 2013-11-01 ENCOUNTER — Other Ambulatory Visit: Payer: Self-pay | Admitting: Neurosurgery

## 2013-11-01 DIAGNOSIS — M5412 Radiculopathy, cervical region: Secondary | ICD-10-CM

## 2013-11-09 ENCOUNTER — Ambulatory Visit
Admission: RE | Admit: 2013-11-09 | Discharge: 2013-11-09 | Disposition: A | Discharge status: Home / Self Care | Payer: BC Managed Care – PPO | Source: Ambulatory Visit | Attending: Neurosurgery | Admitting: Neurosurgery

## 2013-11-09 VITALS — BP 145/88 | HR 77

## 2013-11-09 DIAGNOSIS — M5412 Radiculopathy, cervical region: Secondary | ICD-10-CM

## 2013-11-09 MED ORDER — DIAZEPAM 5 MG PO TABS
10.0000 mg | ORAL_TABLET | Freq: Once | ORAL | Status: AC
  Administered 2013-11-09: 10 mg via ORAL

## 2013-11-09 MED ORDER — DIPHENHYDRAMINE HCL 25 MG PO CAPS: 25.0000 mg | ORAL_CAPSULE | Freq: Once | ORAL | Status: DC

## 2013-11-09 MED ORDER — ONDANSETRON HCL 4 MG/2ML IJ SOLN
4.0000 mg | Freq: Once | INTRAMUSCULAR | Status: AC
  Administered 2013-11-09: 4 mg via INTRAMUSCULAR

## 2013-11-09 MED ORDER — IOHEXOL 300 MG/ML  SOLN
10.0000 mL | Freq: Once | INTRAMUSCULAR | Status: AC | PRN
  Administered 2013-11-09: 10 mL via INTRATHECAL

## 2013-11-09 MED ORDER — HYDROMORPHONE HCL PF 2 MG/ML IJ SOLN
2.0000 mg | Freq: Once | INTRAMUSCULAR | Status: AC
  Administered 2013-11-09: 2 mg via INTRAMUSCULAR

## 2013-11-09 NOTE — Progress Notes (Signed)
Dr. Carlota RaspberryLamke in to speak with/consent patient for myelogram.  Patient states she has been off Trazodone and Lexapro for at least the past two days.  jkl

## 2013-11-09 NOTE — Progress Notes (Signed)
Dr. Carlota RaspberryLamke in to speak with patient after she complained about having "a hard time taking a deep breath."  Denies any numbness or tingling other than pre-existing RUE symptoms.  Complaining, also, of itching after receiving Dilaudid for pain.  Will give Benadryl PO.  jkl

## 2013-11-09 NOTE — Discharge Instructions (Signed)
Myelogram Discharge Instructions  1. Go home and rest quietly for the next 24 hours.  It is important to lie flat for the next 24 hours.  Get up only to go to the restroom.  You may lie in the bed or on a couch on your back, your stomach, your left side or your right side.  You may have one pillow under your head.  You may have pillows between your knees while you are on your side or under your knees while you are on your back.  2. DO NOT drive today.  Recline the seat as far back as it will go, while still wearing your seat belt, on the way home.  3. You may get up to go to the bathroom as needed.  You may sit up for 10 minutes to eat.  You may resume your normal diet and medications unless otherwise indicated.  Drink plenty of extra fluids today and tomorrow.  4. The incidence of a spinal headache with nausea and/or vomiting is about 5% (one in 20 patients).  If you develop a headache, lie flat and drink plenty of fluids until the headache goes away.  Caffeinated beverages may be helpful.  If you develop severe nausea and vomiting or a headache that does not go away with flat bed rest, call [phone].  5. You may resume normal activities after your 24 hours of bed rest is over; however, do not exert yourself strongly or do any heavy lifting tomorrow.  6. Call your physician for a follow-up appointment.   You may resume Lexapro and Tramadol on Wednesday, November 10, 2013 after 1:00p.m.

## 2013-11-11 ENCOUNTER — Telehealth: Payer: Self-pay

## 2013-11-11 NOTE — Telephone Encounter (Signed)
Returned patient's call asking if it were normal to be j"really dizzy and off-balance" after the 24-hour recovery from her myelogram.  "I feel drunk."  She denies headache or nausea or any other positional symptoms.  We did discuss the fact she had received Valium 10mg  PO, Dilaudid 2mg  w/ Zofran 4mg  IM, and Benadryl 50mg  PO during her stay here.  She offered that she took additional Benadryl when she got home for the significant itching the Dilaudid seemed to cause her.  She states she feels better today (two days after the myelo) than yesterday but still has occasional bouts of feeling drunk.  She states she will stay home from work today and just stay in bed; will call me if symptoms continue, though she understands these symptoms are not typical after a myelogram.  Donell SievertJeanne Pelagia Iacobucci, RN

## 2015-01-06 ENCOUNTER — Other Ambulatory Visit: Payer: Self-pay | Admitting: Obstetrics and Gynecology

## 2015-01-09 LAB — CYTOLOGY - PAP

## 2015-05-23 ENCOUNTER — Ambulatory Visit: Payer: BLUE CROSS/BLUE SHIELD

## 2015-05-23 ENCOUNTER — Encounter: Payer: Self-pay | Admitting: Physician Assistant

## 2015-05-23 ENCOUNTER — Ambulatory Visit (INDEPENDENT_AMBULATORY_CARE_PROVIDER_SITE_OTHER): Payer: BLUE CROSS/BLUE SHIELD | Admitting: Emergency Medicine

## 2015-05-23 ENCOUNTER — Observation Stay (HOSPITAL_COMMUNITY)
Admission: EM | Admit: 2015-05-23 | Discharge: 2015-05-25 | Disposition: A | Discharge status: Home / Self Care | Payer: BLUE CROSS/BLUE SHIELD | Attending: Family Medicine | Admitting: Family Medicine

## 2015-05-23 ENCOUNTER — Emergency Department (HOSPITAL_COMMUNITY): Payer: BLUE CROSS/BLUE SHIELD

## 2015-05-23 ENCOUNTER — Encounter (HOSPITAL_COMMUNITY): Payer: Self-pay | Admitting: Emergency Medicine

## 2015-05-23 VITALS — BP 110/70 | HR 84 | Temp 97.6°F | Resp 20

## 2015-05-23 DIAGNOSIS — I10 Essential (primary) hypertension: Secondary | ICD-10-CM | POA: Diagnosis present

## 2015-05-23 DIAGNOSIS — R0602 Shortness of breath: Secondary | ICD-10-CM | POA: Diagnosis not present

## 2015-05-23 DIAGNOSIS — R079 Chest pain, unspecified: Secondary | ICD-10-CM | POA: Insufficient documentation

## 2015-05-23 DIAGNOSIS — E119 Type 2 diabetes mellitus without complications: Secondary | ICD-10-CM

## 2015-05-23 DIAGNOSIS — E669 Obesity, unspecified: Secondary | ICD-10-CM

## 2015-05-23 DIAGNOSIS — J45909 Unspecified asthma, uncomplicated: Secondary | ICD-10-CM | POA: Insufficient documentation

## 2015-05-23 DIAGNOSIS — E785 Hyperlipidemia, unspecified: Secondary | ICD-10-CM | POA: Diagnosis present

## 2015-05-23 DIAGNOSIS — R42 Dizziness and giddiness: Secondary | ICD-10-CM | POA: Diagnosis not present

## 2015-05-23 DIAGNOSIS — Z8249 Family history of ischemic heart disease and other diseases of the circulatory system: Secondary | ICD-10-CM | POA: Diagnosis not present

## 2015-05-23 DIAGNOSIS — R0789 Other chest pain: Secondary | ICD-10-CM | POA: Insufficient documentation

## 2015-05-23 DIAGNOSIS — F329 Major depressive disorder, single episode, unspecified: Secondary | ICD-10-CM | POA: Insufficient documentation

## 2015-05-23 DIAGNOSIS — E1169 Type 2 diabetes mellitus with other specified complication: Secondary | ICD-10-CM | POA: Insufficient documentation

## 2015-05-23 DIAGNOSIS — I200 Unstable angina: Secondary | ICD-10-CM | POA: Diagnosis not present

## 2015-05-23 LAB — CBC
HEMATOCRIT: 34.7 % — AB (ref 36.0–46.0)
Hemoglobin: 11.6 g/dL — ABNORMAL LOW (ref 12.0–15.0)
MCH: 28.6 pg (ref 26.0–34.0)
MCHC: 33.4 g/dL (ref 30.0–36.0)
MCV: 85.7 fL (ref 78.0–100.0)
PLATELETS: 289 10*3/uL (ref 150–400)
RBC: 4.05 MIL/uL (ref 3.87–5.11)
RDW: 13.9 % (ref 11.5–15.5)
WBC: 7.6 10*3/uL (ref 4.0–10.5)

## 2015-05-23 LAB — BASIC METABOLIC PANEL
ANION GAP: 9 (ref 5–15)
BUN: 18 mg/dL (ref 6–20)
CALCIUM: 9.2 mg/dL (ref 8.9–10.3)
CO2: 26 mmol/L (ref 22–32)
Chloride: 102 mmol/L (ref 101–111)
Creatinine, Ser: 0.85 mg/dL (ref 0.44–1.00)
GFR calc Af Amer: >60 mL/min (ref >60)
GFR calc non Af Amer: >60 mL/min (ref >60)
Glucose, Bld: 101 mg/dL — ABNORMAL HIGH (ref 65–99)
POTASSIUM: 3.6 mmol/L (ref 3.5–5.1)
Sodium: 137 mmol/L (ref 135–145)

## 2015-05-23 LAB — TROPONIN I

## 2015-05-23 LAB — I-STAT TROPONIN, ED: Troponin i, poc: 0 ng/mL (ref 0.00–0.08)

## 2015-05-23 MED ORDER — NITROGLYCERIN IN D5W 200-5 MCG/ML-% IV SOLN
2.0000 ug/min | INTRAVENOUS | Status: DC
  Administered 2015-05-23: 5 ug/min via INTRAVENOUS
  Administered 2015-05-23: 20 ug/min via INTRAVENOUS
  Administered 2015-05-23: 15 ug/min via INTRAVENOUS
  Administered 2015-05-23: 10 ug/min via INTRAVENOUS
  Filled 2015-05-23: qty 250

## 2015-05-23 MED ORDER — NITROGLYCERIN 0.3 MG SL SUBL
0.8000 mg | SUBLINGUAL_TABLET | Freq: Once | SUBLINGUAL | Status: AC
  Administered 2015-05-23: 0.9 mg via SUBLINGUAL

## 2015-05-23 MED ORDER — FENTANYL CITRATE (PF) 100 MCG/2ML IJ SOLN
50.0000 ug | Freq: Once | INTRAMUSCULAR | Status: AC
  Administered 2015-05-23: 50 ug via INTRAVENOUS
  Filled 2015-05-23: qty 2

## 2015-05-23 MED ORDER — ONDANSETRON HCL 4 MG/2ML IJ SOLN
4.0000 mg | Freq: Once | INTRAMUSCULAR | Status: AC
  Administered 2015-05-23: 4 mg via INTRAVENOUS
  Filled 2015-05-23: qty 2

## 2015-05-23 NOTE — ED Notes (Signed)
Phlebotomy at bedside.

## 2015-05-23 NOTE — ED Provider Notes (Signed)
CSN: [id number]     Arrival date & time 05/23/15  2140 History   First MD Initiated Contact with Patient 05/23/15 2159     Chief Complaint  Patient presents with  . Chest Pain     (Consider location/radiation/quality/duration/timing/severity/associated sxs/prior Treatment) HPI Comments: Chest pain started at 7:30, whilst she was working. She went to urgent care, and was transferred to the ER via EMS. Pt has no hx of similar pain, no provocative testing hx. Pt has no hx of PE, DVT and denies any exogenous estrogen use, long distance travels or surgery in the past 6 weeks, active cancer, recent immobilization. No numbness, tingling.   ROS 10 Systems reviewed and are negative for acute change except as noted in the HPI.     Patient is a 59 y.o. female presenting with chest pain. The history is provided by the patient.  Chest Pain Pain location:  L chest Pain quality: dull and sharp   Pain radiates to:  L jaw and L arm Relieved by:  Nitroglycerin Associated symptoms: diaphoresis, dizziness, fatigue and shortness of breath   Associated symptoms: not vomiting     Past Medical History  Diagnosis Date  . Depression   . Asthma   . Diabetes mellitus without complication   . Hyperlipidemia   . Hypertension    Past Surgical History  Procedure Laterality Date  . Appendectomy    . Cesarean section    . Ureter surgery    . Nasal sinus surgery    . Knee scopes     Family History  Problem Relation Age of Onset  . Cancer Mother   . Arthritis Sister   . Arthritis Maternal Grandmother   . Stroke Maternal Grandmother   . Cancer Maternal Grandfather   . Stroke Paternal Grandmother   . Cancer Paternal Grandfather    History  Substance Use Topics  . Smoking status: Never Smoker   . Smokeless tobacco: Not on file  . Alcohol Use: Yes     Comment: socially    OB History    No data available     Review of Systems  Constitutional: Positive for diaphoresis and fatigue.   Respiratory: Positive for chest tightness and shortness of breath.   Cardiovascular: Positive for chest pain.  Gastrointestinal: Negative for vomiting.  Neurological: Positive for dizziness.  All other systems reviewed and are negative.     Allergies  Codeine and Percocet  Home Medications   Prior to Admission medications   Medication Sig Start Date End Date Taking? Authorizing Provider  amLODipine (NORVASC) 2.5 MG tablet Take 2.5 mg by mouth daily.   Yes Historical Provider, MD  aspirin 81 MG tablet Take 81 mg by mouth daily.   Yes Historical Provider, MD  atorvastatin (LIPITOR) 20 MG tablet Take 20 mg by mouth daily.   Yes Historical Provider, MD  Calcium Carbonate (CALCIUM 600 PO) Take 2 tablets by mouth daily.    Yes Historical Provider, MD  Coenzyme Q10 200 MG capsule Take 200 mg by mouth at bedtime.   Yes Historical Provider, MD  escitalopram (LEXAPRO) 10 MG tablet Take 20 mg by mouth daily.    Yes Historical Provider, MD  fish oil-omega-3 fatty acids 1000 MG capsule Take 2 g by mouth daily.   Yes Historical Provider, MD  glipiZIDE (GLUCOTROL XL) 2.5 MG 24 hr tablet Take 2.5 mg by mouth daily with breakfast. Take along with the  tablet to make a total of 7.5mg  daily per patient  Yes Historical Provider, MD  glipiZIDE (GLUCOTROL XL) 5 MG 24 hr tablet Take 5 mg by mouth daily with breakfast. Take along with the 2.5mg  tablet to make a total of 7.5mg  daily   Yes Historical Provider, MD  lisinopril-hydrochlorothiazide (PRINZIDE,ZESTORETIC) 20-25 MG per tablet Take 1 tablet by mouth daily.   Yes Historical Provider, MD  loratadine (CLARITIN) 10 MG tablet Take 10 mg by mouth daily.   Yes Historical Provider, MD  Magnesium 500 MG TABS Take 1 tablet by mouth daily.   Yes Historical Provider, MD  Multiple Vitamin (MULTIVITAMIN) capsule Take 1 capsule by mouth daily.   Yes Historical Provider, MD  Multiple Vitamins-Minerals (HAIR/SKIN/NAILS PO) Take 1 tablet by mouth daily.   Yes  Historical Provider, MD  nitroGLYCERIN (NITROSTAT) 0.4 MG SL tablet Place 0.4 mg under the tongue every 5 (five) minutes as needed for chest pain.   Yes Historical Provider, MD  Probiotic Product (PROBIOTIC PO) Take 1 tablet by mouth daily.   Yes Historical Provider, MD  traZODone (DESYREL) 100 MG tablet Take 200 mg by mouth at bedtime.    Yes Historical Provider, MD   BP 119/63 mmHg  Pulse 70  Temp(Src) 97.8 F (36.6 C) (Oral)  Resp 18  Ht 5\' 1"  (1.549 m)  Wt 190 lb (86.183 kg)  BMI 35.92 kg/m2  SpO2 95% Physical Exam  Constitutional: She is oriented to person, place, and time. She appears well-developed and well-nourished.  HENT:  Head: Normocephalic and atraumatic.  Eyes: EOM are normal. Pupils are equal, round, and reactive to light.  Neck: Neck supple.  Cardiovascular: Normal rate, regular rhythm and normal heart sounds.   No murmur heard. Pulmonary/Chest: Effort normal. No respiratory distress.  Abdominal: Soft. She exhibits no distension. There is no tenderness. There is no rebound and no guarding.  Neurological: She is alert and oriented to person, place, and time.  Skin: Skin is warm and dry.  Nursing note and vitals reviewed.   ED Course  Procedures (including critical care time) Labs Review Labs Reviewed  BASIC METABOLIC PANEL - Abnormal; Notable for the following:    Glucose, Bld 101 (*)    All other components within normal limits  CBC - Abnormal; Notable for the following:    Hemoglobin 11.6 (*)    HCT 34.7 (*)    All other components within normal limits  TROPONIN I  Rosezena SensorI-STAT TROPOININ, ED    Imaging Review Dg Chest Port 1 View  05/23/2015   CLINICAL DATA:  59 year old female with left-sided chest pain  EXAM: PORTABLE CHEST - 1 VIEW  COMPARISON:  Radiograph dated 08/02/2013  FINDINGS: The heart size and mediastinal contours are within normal limits. Both lungs are clear. The visualized skeletal structures are unremarkable.  IMPRESSION: No active disease.    Electronically Signed   By: Elgie CollardArash  Radparvar M.D.   On: 05/23/2015 22:25     EKG Interpretation   Date/Time:  Tuesday May 23 2015 21:52:20 EDT Ventricular Rate:  71 PR Interval:  121 QRS Duration: 89 QT Interval:  423 QTC Calculation: 460 R Axis:   28 Text Interpretation:  Sinus rhythm No acute changes No significant change  since last tracing Confirmed by Devante Capano, MD, Janey GentaANKIT [id number]) on 05/23/2015  10:11:05 PM      MDM   Final diagnoses:  Chest pain  Unstable angina    Pt with typical chest pain, L sided, with radiation to the neck, jaw and shoulder/arm. Differential diagnosis includes: ACS syndrome CHF exacerbation Myocarditis Pericarditis  Pericardial effusion Pneumonia Pleural effusion Pulmonary edema PE Musculoskeletal pain  Pt comes in with cc of chest pain. Concerning hx., with concerning risk factors. No clinical concerns for dissection, PE - no pleuritic component, no neuro deficits, normal vascular exam.  Initial workup is neg. Will admit, nitro drip on.  CRITICAL CARE Performed by: Derwood Kaplan   Total critical care time: 40 minutes  Critical care time was exclusive of separately billable procedures and treating other patients.  Critical care was necessary to treat or prevent imminent or life-threatening deterioration.  Critical care was time spent personally by me on the following activities: development of treatment plan with patient and/or surrogate as well as nursing, discussions with consultants, evaluation of patient's response to treatment, examination of patient, obtaining history from patient or surrogate, ordering and performing treatments and interventions, ordering and review of laboratory studies, ordering and review of radiographic studies, pulse oximetry and re-evaluation of patient's condition.   Derwood Kaplan, MD 05/24/15 [phone]

## 2015-05-23 NOTE — Progress Notes (Signed)
   05/23/2015 at 9:05 PM  Nils PyleLori H Osika / DOB: 14-May-1956 / MRN: [id number]  The patient has HTN (hypertension) and Other and unspecified hyperlipidemia on her problem list.  SUBJECTIVE  Chief complaint: No chief complaint on file.  Patient here today for left sided chest pain that started while she was at work. She reports her pain radiates to her jaw, left arm, and she also reports some SOB. Nothing makes the pain better or worse. She has never had this problem before.  She has a history of hypertension, diabetes, and dyslipidemia. She has never smoked.     She  has a past medical history of Depression; Asthma; Diabetes mellitus without complication; Hyperlipidemia; and Hypertension.    Medications reviewed and updated by myself where necessary, and exist elsewhere in the encounter.   Ms. Janan RidgeKwapil is allergic to codeine and percocet. She  reports that she has never smoked. She does not have any smokeless tobacco history on file. She reports that she drinks alcohol. She  has no sexual activity history on file. The patient  has past surgical history that includes Appendectomy; Cesarean section; Ureter surgery; Nasal sinus surgery; and knee scopes.  Her family history includes Arthritis in her maternal grandmother and sister; Cancer in her maternal grandfather, mother, and paternal grandfather; Stroke in her maternal grandmother and paternal grandmother.  Review of Systems  Constitutional: Negative for fever and chills.    OBJECTIVE  Her  oral temperature is 97.6 F (36.4 C). Her blood pressure is 110/70 and her pulse is 84. Her respiration is 20 and oxygen saturation is 98%.  The patient's body mass index is unknown because there is no weight on file.  Physical Exam  Nursing note and vitals reviewed. Constitutional: She is oriented to person, place, and time. She appears distressed.  She is obese.   Cardiovascular: Normal rate.   Respiratory: Effort normal and breath sounds normal.  GI:  Soft. Bowel sounds are normal.  Neurological: She is alert and oriented to person, place, and time.  Skin: Skin is warm and dry. No rash noted. No erythema. No pallor.  Psychiatric: She has a normal mood and affect. Her behavior is normal. Judgment and thought content normal.    No results found for this or any previous visit (from the past 24 hour(s)).  ASSESSMENT & PLAN  Diagnoses and all orders for this visit:  Chest pain, unspecified chest pain type: [facility]   Orders: -     EKG 12-Lead    The patient was advised to call or come back to clinic if she does not see an improvement in symptoms, or worsens with the above plan.   Deliah BostonMichael Linde Wilensky, MHS, PA-C [facility] 05/23/2015 9:05 PM

## 2015-05-23 NOTE — ED Notes (Signed)
MD at bedside. 

## 2015-05-23 NOTE — ED Notes (Signed)
Per EMS:  Pt from [facility].  C/o CP with dull jaw and neck pain and radiation of sharpness to left arm.  Pt experienced nausea, diaphoresis, SOB at work when pain began.  Pt given 2 nitro at [facility], 2 nitro en route via EMS,  324 ASA and  zofran.  EMS sts EKG unremarkable.  Pt axo in room at this time.

## 2015-05-24 ENCOUNTER — Encounter (HOSPITAL_COMMUNITY): Payer: Self-pay

## 2015-05-24 ENCOUNTER — Observation Stay (HOSPITAL_COMMUNITY): Payer: BLUE CROSS/BLUE SHIELD

## 2015-05-24 DIAGNOSIS — E119 Type 2 diabetes mellitus without complications: Secondary | ICD-10-CM | POA: Diagnosis not present

## 2015-05-24 DIAGNOSIS — I200 Unstable angina: Principal | ICD-10-CM

## 2015-05-24 DIAGNOSIS — R0602 Shortness of breath: Secondary | ICD-10-CM | POA: Diagnosis not present

## 2015-05-24 DIAGNOSIS — R079 Chest pain, unspecified: Secondary | ICD-10-CM | POA: Diagnosis not present

## 2015-05-24 DIAGNOSIS — E785 Hyperlipidemia, unspecified: Secondary | ICD-10-CM

## 2015-05-24 DIAGNOSIS — E669 Obesity, unspecified: Secondary | ICD-10-CM

## 2015-05-24 DIAGNOSIS — I10 Essential (primary) hypertension: Secondary | ICD-10-CM | POA: Diagnosis not present

## 2015-05-24 DIAGNOSIS — R0789 Other chest pain: Secondary | ICD-10-CM | POA: Diagnosis not present

## 2015-05-24 DIAGNOSIS — R42 Dizziness and giddiness: Secondary | ICD-10-CM | POA: Diagnosis not present

## 2015-05-24 LAB — D-DIMER, QUANTITATIVE: D-Dimer, Quant: 0.54 ug/mL-FEU — ABNORMAL HIGH (ref 0.00–0.48)

## 2015-05-24 LAB — TROPONIN I
TROPONIN I: 0.05 ng/mL — AB (ref <0.031)
Troponin I: <0.03 ng/mL (ref <0.031)

## 2015-05-24 LAB — LIPID PANEL
Cholesterol: 143 mg/dL (ref 0–200)
HDL: 45 mg/dL (ref >40)
LDL CALC: 79 mg/dL (ref 0–99)
TRIGLYCERIDES: 96 mg/dL (ref <150)
Total CHOL/HDL Ratio: 3.2 RATIO
VLDL: 19 mg/dL (ref 0–40)

## 2015-05-24 LAB — GLUCOSE, CAPILLARY
GLUCOSE-CAPILLARY: 118 mg/dL — AB (ref 65–99)
Glucose-Capillary: 157 mg/dL — ABNORMAL HIGH (ref 65–99)
Glucose-Capillary: 132 mg/dL — ABNORMAL HIGH (ref 65–99)

## 2015-05-24 LAB — MRSA PCR SCREENING: MRSA BY PCR: NEGATIVE

## 2015-05-24 LAB — TSH: TSH: 1.163 u[IU]/mL (ref 0.350–4.500)

## 2015-05-24 MED ORDER — HEPARIN SODIUM (PORCINE) 5000 UNIT/ML IJ SOLN
5000.0000 [IU] | Freq: Three times a day (TID) | INTRAMUSCULAR | Status: DC
  Administered 2015-05-24 – 2015-05-25 (×4): 5000 [IU] via SUBCUTANEOUS
  Filled 2015-05-24 (×8): qty 1

## 2015-05-24 MED ORDER — METOPROLOL TARTRATE 12.5 MG HALF TABLET
12.5000 mg | ORAL_TABLET | Freq: Two times a day (BID) | ORAL | Status: DC
  Administered 2015-05-24 (×2): 12.5 mg via ORAL
  Filled 2015-05-24 (×3): qty 1

## 2015-05-24 MED ORDER — FENTANYL CITRATE (PF) 100 MCG/2ML IJ SOLN: 25.0000 ug | INTRAMUSCULAR | Status: DC | PRN

## 2015-05-24 MED ORDER — GI COCKTAIL ~~LOC~~
30.0000 mL | Freq: Four times a day (QID) | ORAL | Status: DC | PRN
  Administered 2015-05-24: 30 mL via ORAL
  Filled 2015-05-24 (×2): qty 30

## 2015-05-24 MED ORDER — ATORVASTATIN CALCIUM 40 MG PO TABS
40.0000 mg | ORAL_TABLET | Freq: Every day | ORAL | Status: DC
  Administered 2015-05-24: 40 mg via ORAL
  Filled 2015-05-24 (×2): qty 1

## 2015-05-24 MED ORDER — ACETAMINOPHEN 325 MG PO TABS
650.0000 mg | ORAL_TABLET | ORAL | Status: DC | PRN
  Administered 2015-05-24 (×3): 650 mg via ORAL
  Filled 2015-05-24 (×3): qty 2

## 2015-05-24 MED ORDER — HYDROCHLOROTHIAZIDE 25 MG PO TABS: 25.0000 mg | ORAL_TABLET | Freq: Every day | ORAL | Status: DC

## 2015-05-24 MED ORDER — ESCITALOPRAM OXALATE 20 MG PO TABS
20.0000 mg | ORAL_TABLET | Freq: Every day | ORAL | Status: DC
  Administered 2015-05-24 – 2015-05-25 (×2): 20 mg via ORAL
  Filled 2015-05-24 (×2): qty 1

## 2015-05-24 MED ORDER — ASPIRIN EC 325 MG PO TBEC
325.0000 mg | DELAYED_RELEASE_TABLET | Freq: Every day | ORAL | Status: DC
  Administered 2015-05-24: 325 mg via ORAL
  Filled 2015-05-24: qty 1

## 2015-05-24 MED ORDER — NITROGLYCERIN 0.4 MG SL SUBL
0.4000 mg | SUBLINGUAL_TABLET | SUBLINGUAL | Status: DC | PRN
  Administered 2015-05-24: 0.4 mg via SUBLINGUAL
  Filled 2015-05-24: qty 1

## 2015-05-24 MED ORDER — INSULIN ASPART 100 UNIT/ML ~~LOC~~ SOLN
0.0000 [IU] | Freq: Three times a day (TID) | SUBCUTANEOUS | Status: DC
  Administered 2015-05-25: 2 [IU] via SUBCUTANEOUS

## 2015-05-24 MED ORDER — IOHEXOL 350 MG/ML SOLN
100.0000 mL | Freq: Once | INTRAVENOUS | Status: AC | PRN
  Administered 2015-05-24: 100 mL via INTRAVENOUS

## 2015-05-24 MED ORDER — LISINOPRIL 20 MG PO TABS
20.0000 mg | ORAL_TABLET | Freq: Every day | ORAL | Status: DC
  Administered 2015-05-24 – 2015-05-25 (×2): 20 mg via ORAL
  Filled 2015-05-24 (×2): qty 1

## 2015-05-24 MED ORDER — AMLODIPINE BESYLATE 2.5 MG PO TABS: 2.5000 mg | ORAL_TABLET | Freq: Every day | ORAL | Status: DC

## 2015-05-24 MED ORDER — ONDANSETRON HCL 4 MG/2ML IJ SOLN: 4.0000 mg | Freq: Four times a day (QID) | INTRAMUSCULAR | Status: DC | PRN

## 2015-05-24 MED ORDER — PANTOPRAZOLE SODIUM 40 MG PO TBEC
40.0000 mg | DELAYED_RELEASE_TABLET | Freq: Two times a day (BID) | ORAL | Status: DC
  Administered 2015-05-24 – 2015-05-25 (×3): 40 mg via ORAL
  Filled 2015-05-24 (×3): qty 1

## 2015-05-24 MED ORDER — LISINOPRIL-HYDROCHLOROTHIAZIDE 20-25 MG PO TABS: 1.0000 | ORAL_TABLET | Freq: Every day | ORAL | Status: DC

## 2015-05-24 MED ORDER — ASPIRIN EC 81 MG PO TBEC
81.0000 mg | DELAYED_RELEASE_TABLET | Freq: Every day | ORAL | Status: DC
  Administered 2015-05-25: 81 mg via ORAL
  Filled 2015-05-24: qty 1

## 2015-05-24 NOTE — Progress Notes (Signed)
Nutrition Brief Note  Patient identified on the Malnutrition Screening Tool (MST) Report.  Wt Readings from Last 15 Encounters:  05/24/15 191 lb 2.2 oz (86.7 kg)  07/23/13 186 lb (84.369 kg)  07/19/13 186 lb 3.2 oz (84.46 kg)  07/16/13 183 lb (83.008 kg)    Body mass index is 36.13 kg/(m^2). Patient meets criteria for Obesity Class II based on current BMI.   Current diet order is NPO. Labs and medications reviewed.   No nutrition interventions warranted at this time. If nutrition issues arise, please consult RD.   Maureen ChattersKatie Tamitha Norell, RD, LDN Pager #: [phone] After-Hours Pager #: [phone]

## 2015-05-24 NOTE — Discharge Summary (Signed)
Family Medicine Teaching [facility] Discharge Summary  Patient name: Lauren Ochoa Medical record number: [id number] Date of birth: 09/13/1956 Age: 59 y.o. Gender: female Date of Admission: 05/23/2015  Date of Discharge: 05/25/2015 Admitting Physician: Nestor RampSara L Neal, MD  Primary Care Provider: Darrow BussingKOIRALA,DIBAS, MD Consultants: Cardiology  Indication for Hospitalization: Anginal chest pain  Discharge Diagnoses/Problem List:  Patient Active Problem List   Diagnosis Date Noted  . Chest pain   . Unstable angina 05/24/2015  . Diabetes mellitus type 2 in obese 05/23/2015  . HTN (hypertension) 07/19/2013  . Hyperlipidemia 07/19/2013   Disposition: home  Discharge Condition: stable  Discharge Exam:  See the progress note from the day of discharge.  Brief Hospital Course:  Lauren PyleLori H Letarte is a 59 y.o. female presenting with chest pain. PMH is significant for DM-2, HTN and HLD.  Chest pain: atypical in nature (sharp substernal, onset while standing & alleviated with nitro) radiating to the back, left arm and jaw. Patient was HDS. No signs and symptoms of infection. CXR w/o pulmonary and mediastinal processes. EKG NSR w/o signs of ischemia. Tele strips without signs of arrhythmia. Troponin 0.03>0.03>0.03>0.05>0.03>0.03. HEART score of 4. CTA negative. TSH and lipid panels WNL. Cardiology consulted and recommended stress test as outpatient.  Received ASA, nitro drip, then sublingual, home atorvastatin, home lisinopril, Fentanyl IV PRN pain, GI cocktail & Zofran prn nausea.  Other chronic conditions were stable  Issues for Follow Up:  1. Anginal chest pain: outpatient stress test   Significant Procedures: none  Significant Labs and Imaging: CTA negative for PE  Recent Labs Lab 05/23/15 2242  WBC 7.6  HGB 11.6*  HCT 34.7*  PLT 289    Recent Labs Lab 05/23/15 2242  NA 137  K 3.6  CL 102  CO2 26  GLUCOSE 101*  BUN 18  CREATININE 0.85  CALCIUM 9.2    Results/Tests  Pending at Time of Discharge: none  Discharge Medications:    Medication List    TAKE these medications        amLODipine 2.5 MG tablet  Commonly known as:  NORVASC  Take 2.5 mg by mouth daily.     aspirin 81 MG tablet  Take 81 mg by mouth daily.     atorvastatin 20 MG tablet  Commonly known as:  LIPITOR  Take 20 mg by mouth daily.     CALCIUM 600 PO  Take 2 tablets by mouth daily.     Coenzyme Q10 200 MG capsule  Take 200 mg by mouth at bedtime.     escitalopram 10 MG tablet  Commonly known as:  LEXAPRO  Take 20 mg by mouth daily.     fish oil-omega-3 fatty acids 1000 MG capsule  Take 2 g by mouth daily.     glipiZIDE 2.5 MG 24 hr tablet  Commonly known as:  GLUCOTROL XL  Take 2.5 mg by mouth daily with breakfast. Take along with the 5mg  tablet to make a total of 7.5mg  daily per patient     glipiZIDE 5 MG 24 hr tablet  Commonly known as:  GLUCOTROL XL  Take 5 mg by mouth daily with breakfast. Take along with the 2.5mg  tablet to make a total of 7.5mg  daily     HAIR/SKIN/NAILS PO  Take 1 tablet by mouth daily.     lisinopril-hydrochlorothiazide 20-25 MG per tablet  Commonly known as:  PRINZIDE,ZESTORETIC  Take 1 tablet by mouth daily.     loratadine 10 MG tablet  Commonly known  as:  CLARITIN  Take 10 mg by mouth daily.     Magnesium 500 MG Tabs  Take 1 tablet by mouth daily.     multivitamin capsule  Take 1 capsule by mouth daily.     nitroGLYCERIN 0.4 MG SL tablet  Commonly known as:  NITROSTAT  Place 0.4 mg under the tongue every 5 (five) minutes as needed for chest pain.     PROBIOTIC PO  Take 1 tablet by mouth daily.     traZODone 100 MG tablet  Commonly known as:  DESYREL  Take 200 mg by mouth at bedtime.        Discharge Instructions: Please refer to Patient Instructions section of EMR for full details.  Patient was counseled important signs and symptoms that should prompt return to medical care, changes in medications, dietary instructions,  activity restrictions, and follow up appointments.   Follow-Up Appointments: Follow-up Information    Follow up with Lars Masson, MD.   Specialty:  Cardiology   Why:  Exercise test tomorrow 9:45 am   Contact information:   [address] [place] [address] [phone]       Schedule an appointment as soon as possible for a visit with Darrow Bussing, MD.   Specialty:  Family Medicine   Contact information:   [address] [place] [address] [phone]       Almon Hercules, MD 05/26/2015, 2:04 PM PGY-1, [facility] Family Medicine

## 2015-05-24 NOTE — H&P (Signed)
Family Medicine Teaching [facility] Admission History and Physical Service Pager: [phone]  Patient name: Lauren Ochoa Medical record number: [id number] Date of birth: [birth year] Age: 59 y.o. Gender: female  Primary Care Provider: Darrow Bussing, MD Consultants: cardiology Code Status: FULL (discussed on admission)  Chief Complaint: Chest pain  Assessment and Plan: Lauren Ochoa is a 59 y.o. female presenting with chest pain. PMH is significant for DM-2, HTN and HLD.  Chest pain: atypical in nature (sharp substernal, onset while standing & alleviated with nitro). ACS is a possibility but EKG & troponin negative. HEART score of 4. Pain radiating to back concerning for dissection but patient HDS and CXR normal. PE is less likely in the absence of tachycardia, tachypnea and risk factors. Wells' score 0. GERD is a possibility but no hx of reflux. Has hx of recent strep throat and flu as well as SOB but less likely to be PNA without signs of infection or other respiratory s&s. DDimer 0.54 - Admit to step down with tele - Given ASA by EMS en route to ED. Continue daily ASA. - Continue nitro drip. May consider switching to sublingual once pain is under control. - Start Metoprolol 12.5mg  PO BID - Continue home lisinopril 20 mg PO qd - Fentanyl IV PRN pain - Oxygen for SpO2 < 90% - GI cocktail - Recycle troponin x3 (0.03> - Zofran 4 mg IV q6gh prn nausea - EKG in AM - Risk stratification labs - CTA chest ordered - Cardiology contacted by ED provider.  F/u in am to make sure that she is on the list to be seen.  Hypertension: HDS. on amlodipine 2.5 mg and lisinopril/HCTZ 20/25 at home - Continue home lisinopril 20 mg qd - Hold amlodipine  - Hold HCTZ - Start metoprolol as above - Resume home medication as BP allows  DM-2: BMP gluc 101. On glipizide XL 7.5 mg  - Check A1c - SSI - CBG monitoring  Dyslipidemia: on atorvastatin 20 mg at home - Will increase to Atorvastatin 40 mg  qd - lipid panel  Depression: on Lexapro 10 mg at home. - Will continue home Lexapro  FEN/GI: IVF, replete E as needed, NPO & protonix Prophylaxis: heparin subQ  Disposition: step down tele  History of Present Illness: Lauren Ochoa is a 59 y.o. female presenting with chest pain. 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 Describes the pain as stabbing. Pain radiating to back, left arm and jaw. Pain intensity 10/10 at onset. Pain wasn't alleviated with sitting down. Patient was able to drive herself to [facility] where received nitro which helped with the pain. Pain intensity 4/10 now. Pain initially associated with SOB and fatigue.  SOB resolved.   Patient denies LOC or fall.  She denies h/o chest pain, MI or CVA. Reports taking all of her meds today. She had nausea all day but no emesis. She endorses a dull headache after starting nitro. She denies vision changes, dysphagia, SOB, abdominal pain, edema or fever. She reports some chills for a while.  She reports completing treatment for strep throat and flu about a month ago.  Family hx: Mom w/ h/o MI <50 yo.  Younger sister with several strokes. Patient has never smoked. Drinks socially and denies drug use.   ED course: CBC, BMP, troponin, EKG & CXR WNL. Received fentanyl, Zofran & nitro drips. Given ASA en route to  ED.   Review Of Systems: Per HPI with the following additions: none Otherwise 12 point review of systems was performed and was unremarkable.  Patient Active Problem List   Diagnosis Date Noted  . Unstable angina 05/24/2015  . Diabetes mellitus type 2 in obese 05/23/2015  . HTN (hypertension) 07/19/2013  . Other and unspecified hyperlipidemia 07/19/2013   Past Medical History: Past Medical History  Diagnosis Date  . Depression   . Asthma   . Diabetes mellitus without complication   . Hyperlipidemia   .  Hypertension    Past Surgical History: Past Surgical History  Procedure Laterality Date  . Appendectomy    . Cesarean section    . Ureter surgery    . Nasal sinus surgery    . Knee scopes     Social History: History  Substance Use Topics  . Smoking status: Never Smoker   . Smokeless tobacco: Not on file  . Alcohol Use: Yes     Comment: socially    Additional social history: Please also refer to relevant sections of EMR.  Family History: Family History  Problem Relation Age of Onset  . Cancer Mother   . Arthritis Sister   . Arthritis Maternal Grandmother   . Stroke Maternal Grandmother   . Cancer Maternal Grandfather   . Stroke Paternal Grandmother   . Cancer Paternal Grandfather    Allergies and Medications: Allergies  Allergen Reactions  . Codeine   . Percocet [Oxycodone-Acetaminophen] Itching   No current facility-administered medications on file prior to encounter.   Current Outpatient Prescriptions on File Prior to Encounter  Medication Sig Dispense Refill  . amLODipine (NORVASC) 2.5 MG tablet Take 2.5 mg by mouth daily.    Marland Kitchen. aspirin 81 MG tablet Take 81 mg by mouth daily.    Marland Kitchen. atorvastatin (LIPITOR) 20 MG tablet Take 20 mg by mouth daily.    . Calcium Carbonate (CALCIUM 600 PO) Take 2 tablets by mouth daily.     . Coenzyme Q10 200 MG capsule Take 200 mg by mouth at bedtime.    Marland Kitchen. escitalopram (LEXAPRO) 10 MG tablet Take 20 mg by mouth daily.     . fish oil-omega-3 fatty acids 1000 MG capsule Take 2 g by mouth daily.    Marland Kitchen. lisinopril-hydrochlorothiazide (PRINZIDE,ZESTORETIC) 20-25 MG per tablet Take 1 tablet by mouth daily.    Marland Kitchen. loratadine (CLARITIN) 10 MG tablet Take 10 mg by mouth daily.    . Magnesium 500 MG TABS Take 1 tablet by mouth daily.    . Multiple Vitamin (MULTIVITAMIN) capsule Take 1 capsule by mouth daily.    . nitroGLYCERIN (NITROSTAT) 0.4 MG SL tablet Place 0.4 mg under the tongue every 5 (five) minutes as needed for chest pain.    . traZODone  (DESYREL) 100 MG tablet Take 200 mg by mouth at bedtime.       Objective: BP 122/68 mmHg  Pulse 68  Temp(Src) 97.8 F (36.6 C) (Oral)  Resp 16  Ht 5\' 1"  (1.549 m)  Wt 190 lb (86.183 kg)  BMI 35.92 kg/m2  SpO2 95% Exam: Gen: No acute distress. Alert, cooperative with exam HEENT: Atraumatic, Oropharynx clear. MMM CV: RRR. No murmurs, rubs, or gallops noted. 2+ radial and DP pulses bilaterally. Resp: CTAB. No wheezing, crackles, or rhonchi noted. No increased WOB Abd: +BS. Soft, non-distended, non-tender. No rebound or guarding.  Ext: No edema. No gross deformities. Neuro: Alert and oriented, No gross focal deficits, follows commands  Labs and Imaging: CBC  BMET   Recent Labs Lab 05/23/15 2242  WBC 7.6  HGB 11.6*  HCT 34.7*  PLT 289    Recent Labs Lab 05/23/15 2242  NA 137  K 3.6  CL 102  CO2 26  BUN 18  CREATININE 0.85  GLUCOSE 101*  CALCIUM 9.2     Candelaria Stagers, MD  05/24/2015, 12:27 AM PGY-1, [facility] FPTS Intern pager: [phone], text pages welcome  I have separately seen and examined the patient. I have discussed the findings and exam with Dr Alanda Slim and agree with the above note.  My changes/additions are outlined in BLUE.  Ashly M. Nadine Counts, DO PGY-2, [facility] Family Medicine

## 2015-05-24 NOTE — Progress Notes (Signed)
  Medical screening examination/treatment/procedure(s) were performed by non-physician practitioner and as supervising physician I was immediately available for consultation/collaboration.     

## 2015-05-25 ENCOUNTER — Other Ambulatory Visit: Payer: Self-pay | Admitting: Cardiology

## 2015-05-25 DIAGNOSIS — I200 Unstable angina: Secondary | ICD-10-CM | POA: Diagnosis not present

## 2015-05-25 DIAGNOSIS — E785 Hyperlipidemia, unspecified: Secondary | ICD-10-CM | POA: Diagnosis not present

## 2015-05-25 DIAGNOSIS — R079 Chest pain, unspecified: Secondary | ICD-10-CM | POA: Diagnosis not present

## 2015-05-25 DIAGNOSIS — I10 Essential (primary) hypertension: Secondary | ICD-10-CM | POA: Diagnosis not present

## 2015-05-25 DIAGNOSIS — E119 Type 2 diabetes mellitus without complications: Secondary | ICD-10-CM | POA: Diagnosis not present

## 2015-05-25 LAB — TROPONIN I: Troponin I: <0.03 ng/mL (ref <0.031)

## 2015-05-25 LAB — GLUCOSE, CAPILLARY
Glucose-Capillary: 157 mg/dL — ABNORMAL HIGH (ref 65–99)
Glucose-Capillary: 126 mg/dL — ABNORMAL HIGH (ref 65–99)

## 2015-05-25 LAB — HEMOGLOBIN A1C
HEMOGLOBIN A1C: 7 % — AB (ref 4.8–5.6)
Mean Plasma Glucose: 154 mg/dL

## 2015-05-25 NOTE — Discharge Instructions (Addendum)
It has been pleasure taking care of you! You were hospitalized with chest pain. Causes of chest pain are many. After performing exams and running multiple tests, we think your chest pain is from your heart. This usually happens when the blood vessels in your heart are narrowed enough that your heart muscles don't get enough blood and oxygen. This leads to chest pain and heart muscle injuries of different degrees.  We have given you medications to alleviate your pain and protect your heart. We also involved cardiology (heart doctors) in your care, who recommended that you have a stress test tomorrow.   Things to watch:  -chest pain -shortness of breath -blacking out or lightheadedness  If you notice any of the above symptoms, please go to the nearest hospital immediately or call 911.  Below is some reading on chest pain.   Angina Pectoris Angina pectoris, often just called angina, is extreme discomfort in your chest, neck, or arm caused by a lack of blood in the middle and thickest layer of your heart wall (myocardium). It may feel like tightness or heavy pressure. It may feel like a crushing or squeezing pain. Some people say it feels like gas or indigestion. It may go down your shoulders, back, and arms. Some people may have symptoms other than pain. These symptoms include fatigue, shortness of breath, cold sweats, or nausea. There are four different types of angina:  Stable angina--Stable angina usually occurs in episodes of predictable frequency and duration. It usually is brought on by physical activity, emotional stress, or excitement. These are all times when the myocardium needs more oxygen. Stable angina usually lasts a few minutes and often is relieved by taking a medicine that can be taken under your tongue (sublingually). The medicine is called nitroglycerin. Stable angina is caused by a buildup of plaque inside the arteries, which restricts blood flow to the heart muscle  (atherosclerosis).  Unstable angina--Unstable angina can occur even when your body experiences little or no physical exertion. It can occur during sleep. It can also occur at rest. It can suddenly increase in severity or frequency. It might not be relieved by sublingual nitroglycerin. It can last up to 30 minutes. The most common cause of unstable angina is a blood clot that has developed on the top of plaque buildup inside a coronary artery. It can lead to a heart attack if the blood clot completely blocks the artery.  Microvascular angina--This type of angina is caused by a disorder of tiny blood vessels called arterioles. Microvascular angina is more common in women. The pain may be more severe and last longer than other types of angina pectoris.  Prinzmetal or variant angina--This type of angina pectoris usually occurs when your body experiences little or no physical exertion. It especially occurs in the early morning hours. It is caused by a spasm of your coronary artery. HOME CARE INSTRUCTIONS   Only take over-the-counter and prescription medicines as directed by your health care provider.  Stay active or increase your exercise as directed by your health care provider.  Limit strenuous activity as directed by your health care provider.  Limit heavy lifting as directed by your health care provider.  Maintain a healthy weight.  Learn about and eat heart-healthy foods.  Do not use any tobacco products including cigarettes, chewing tobacco or electronic cigarettes. SEEK IMMEDIATE MEDICAL CARE IF:  You experience the following symptoms:  Chest, neck, deep shoulder, or arm pain or discomfort that lasts more than a few  minutes.  Chest, neck, deep shoulder, or arm pain or discomfort that goes away and comes back, repeatedly.  Heavy sweating with discomfort, without a noticeable cause.  Shortness of breath or difficulty breathing.  Angina that does not get better after a few minutes of  rest or after taking sublingual nitroglycerin. These can all be symptoms of a heart attack, which is a medical emergency! Get medical help at once. Call your local emergency service (911 in [place] immediately. Do not  drive yourself to the hospital and do not  wait to for your symptoms to go away. MAKE SURE YOU:  Understand these instructions.  Will watch your condition.  Will get help right away if you are not doing well or get worse. Document Released: 10/21/2005 Document Revised: 10/26/2013 Document Reviewed: 02/22/2014 [facility] Patient Information 2015 [place], [place]. This information is not intended to replace advice given to you by your health care provider. Make sure you discuss any questions you have with your health care provider.    Nothing to eat or drink after midnight tonight for stress test tomorrow. Hold medications till after your stress test.

## 2015-05-25 NOTE — Progress Notes (Signed)
Patient states that the cardiologist wanted her to have a stress test today, but she had recently eaten and would not be able to do it. Call placed to Dr. Delton See. She states that the patient can not get on the schedule today, so she will be scheduled in her office tomorrow. In the meantime she states patient can go home. Will place call to primary physician for discharge orders.

## 2015-05-25 NOTE — Consult Note (Signed)
CARDIOLOGY CONSULT NOTE   Patient ID: Lauren Ochoa MRN: [id number], DOB/AGE: 11-08-1955   Admit date: 05/23/2015 Date of Consult: 05/25/2015  Primary Physician: Darrow Bussing, MD Primary Cardiologist: new patient/Rhylei Mcquaig, Faustino Congress  Reason for consult:  Chest pain  Problem List  Past Medical History  Diagnosis Date  . Depression   . Asthma   . Diabetes mellitus without complication   . Hyperlipidemia   . Hypertension     Past Surgical History  Procedure Laterality Date  . Appendectomy    . Cesarean section    . Ureter surgery    . Nasal sinus surgery    . Knee scopes      Allergies  Allergies  Allergen Reactions  . Codeine   . Metformin And Related Nausea Only  . 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 This was a first episode, otherwise denies DOE, palpitations, syncope, she is fairly active. She has been treated for DM for 8 years, she has been taking lipitor for HLP and her lipids are WNL. Her mother had MI in her 20'. No more chest pain while in the hospital, she is currently asymptomatic.   Inpatient Medications  . aspirin EC  81 mg Oral Daily  . atorvastatin  40 mg Oral q1800  . escitalopram  20 mg Oral Daily  . heparin  5,000 Units Subcutaneous 3 times per day  . insulin aspart  0-9 Units Subcutaneous TID WC  . lisinopril  20 mg Oral Daily  . pantoprazole  40 mg Oral BID    Family History Family History  Problem Relation Age of Onset  . Cancer Mother   . Arthritis Sister   . Arthritis Maternal Grandmother   . Stroke Maternal Grandmother   . Cancer Maternal Grandfather   . Stroke Paternal Grandmother   . Cancer Paternal Grandfather      Social History History   Social History  . Marital Status: Divorced    Spouse Name: N/A  .  Number of Children: N/A  . Years of Education: N/A   Occupational History  . Not on file.   Social History Main Topics  . Smoking status: Never Smoker   . Smokeless tobacco: Not on file  . Alcohol Use: Yes     Comment: socially   . Drug Use: Not on file  . Sexual Activity: Not on file   Other Topics Concern  . Not on file   Social History Narrative     Review of Systems  General:  No chills, fever, night sweats or weight changes.  Cardiovascular:  No chest pain, dyspnea on exertion, edema, orthopnea, palpitations, paroxysmal nocturnal dyspnea. Dermatological: No rash, lesions/masses Respiratory: No cough, dyspnea Urologic: No hematuria, dysuria Abdominal:   No nausea, vomiting, diarrhea, bright red blood per rectum, melena, or hematemesis Neurologic:  No visual changes, wkns, changes in mental status. All other systems reviewed and are otherwise negative except as noted above.  Physical Exam  Blood pressure 125/73, pulse 65, temperature 97.8 F (36.6 C), temperature source Oral, resp. rate 13, height  (1.549 m), weight 191 lb 2.2 oz (86.7 kg), SpO2 95 %.  General: Pleasant, NAD Psych: Normal affect. Neuro: Alert and oriented X 3. Moves all extremities spontaneously. HEENT: Normal  Neck: Supple without bruits  or JVD. Lungs:  Resp regular and unlabored, CTA. Heart: RRR no s3, s4, or murmurs. Abdomen: Soft, non-tender, non-distended, BS + x 4.  Extremities: No clubbing, cyanosis or edema. DP/PT/Radials 2+ and equal bilaterally.  Labs   Recent Labs  05/24/15 0715 05/24/15 1337 05/24/15 2320 05/25/15 0306  TROPONINI <0.03 0.05* <0.03 <0.03   Lab Results  Component Value Date   WBC 7.6 05/23/2015   HGB 11.6* 05/23/2015   HCT 34.7* 05/23/2015   MCV 85.7 05/23/2015   PLT 289 05/23/2015    Recent Labs Lab 05/23/15 2242  NA 137  K 3.6  CL 102  CO2 26  BUN 18  CREATININE 0.85  CALCIUM 9.2  GLUCOSE 101*   Lab Results  Component Value Date   CHOL  143 05/24/2015   HDL 45 05/24/2015   LDLCALC 79 05/24/2015   TRIG 96 05/24/2015   Lab Results  Component Value Date   DDIMER 0.54* 05/24/2015   Invalid input(s): POCBNP  Radiology/Studies  Ct Angio Chest Pe W/cm &/or Wo Cm  05/24/2015   CLINICAL DATA:  Chest pain radiating to neck and left arm.  Cough.  EXAM: CT ANGIOGRAPHY CHEST WITH CONTRAST  TECHNIQUE: Multidetector CT imaging of the chest was performed using the standard protocol during bolus administration of intravenous contrast. Multiplanar CT image reconstructions and MIPs were obtained to evaluate the vascular anatomy.  CONTRAST:  OMNIPAQUE IOHEXOL 350 MG/ML SOLN  COMPARISON:  Chest radiograph May 23, 2015  FINDINGS: There is no demonstrable pulmonary embolus. There is no thoracic aortic aneurysm or dissection. The visualized great vessels appear normal. Note that the left and right common carotid arteries arise as a common trunk, an anatomic variant.  There is slight dependent atelectasis bilaterally. No lung edema or consolidation.  The visualized thyroid is normal. There are scattered subcentimeter mediastinal lymph nodes but no lymph nodes are present which meet size criteria for pathologic significance. There is mild left ventricular hypertrophy. The pericardium is not thickened.  Visualized upper abdominal structures appear unremarkable.  There are no blastic or lytic bone lesions.  Review of the MIP images confirms the above findings.  IMPRESSION: No demonstrable pulmonary embolus. No lung edema or consolidation. Mild left ventricular hypertrophy. No appreciable adenopathy.   Electronically Signed   By: Bretta Bang III M.D.   On: 05/24/2015 08:38   Dg Chest Port 1 View  05/23/2015   CLINICAL DATA:  59 year old female with left-sided chest pain  EXAM: PORTABLE CHEST - 1 VIEW  COMPARISON:  Radiograph dated 08/02/2013  FINDINGS: The heart size and mediastinal contours are within normal limits. Both lungs are clear. The  visualized skeletal structures are unremarkable.  IMPRESSION: No active disease.   Electronically Signed   By: Elgie Collard M.D.   On: 05/23/2015 22:25   Echocardiogram - none  ECG: SR, normal ECG     ASSESSMENT AND PLAN  59 year old female   1. Atypical chest pain - however significant risk factors including HTN, DM, HLP and FH of premature CAD. She had one minimally elevated troponin  0.03>0.03>0.03>0.05>0.03. She was ruled out for PE and aortic dissection.  CT chest doesn't show evidence of coronary calcium.  We will schedule an outpatient treadmill stress test in our office.  2. HTN - controlled on current regimen  3. HLP - at goal on current regimen with atorvastatin 40 mg po daily.    Signed, Lars Masson, MD, [facility] 05/25/2015, 10:45 AM

## 2015-05-25 NOTE — Progress Notes (Addendum)
Family Medicine Teaching Service Daily Progress Note Intern Pager: [phone]  Patient name: Lauren Ochoa Medical record number: [id number] Date of birth: May 20, 1956 Age: 59 y.o. Gender: female  Primary Care Provider: Darrow Bussing, Ochoa Consultants: cardiology Code Status: full  Pt Overview and Major Events to Date:  7/20: admitted with chest pain  Assessment and Plan: Lauren Ochoa is a 59 y.o. female presenting with chest pain. PMH is significant for DM-2, HTN and HLD.  Chest pain: atypical in nature (sharp substernal, onset while standing & alleviated with nitro). ACS is a possibility but EKG & troponin negative x3. HEART score of 4. Pain radiating to back concerning for dissection but patient HDS and CXR normal. PE is less likely in the absence of tachycardia, tachypnea and risk factors. Wells' score 0. CTA chest negative for PE. GERD is a possibility but no hx of reflux. Has hx of recent strep throat and flu as well as SOB but less likely to be PNA without signs of infection or other respiratory s&s. Given ASA by EMS en route to ED. Pain free for the last 24 hours. Consulted card this morning. - Admit to step down with tele - f/u card recs - Continue daily ASA. - sublingual nitro prn - d/cd metoprolol - Continue home lisinopril 20 mg PO qd - Fentanyl IV PRN pain - Oxygen for SpO2 < 90% - Zofran 4 mg IV q6gh prn nausea  Hypertension: HDS. on amlodipine 2.5 mg and lisinopril/HCTZ 20/25 at home - Continue home lisinopril 20 mg qd - Hold amlodipine  - Hold HCTZ  DM-2: BMP gluc 101. On glipizide XL 7.5 mg. A1c7. CBG 157. - SSI  Dyslipidemia: on atorvastatin 20 mg at home. Lipid panel CH 143, TG 96, HDL 45, LDL 79 - Will increase to Atorvastatin 40 mg qd  Depression: on Lexapro 10 mg at home. - Will continue home Lexapro  FEN/GI: replete E as needed, heart healthy diet & protonix Prophylaxis: heparin subQ  Disposition: step down tele. Home pending card recs.  Subjective:   Reports that she couldn't sleep. Reports that she has been pain free since yesterday morning. Denies SOB, abdominal pain. Has normal BM last night.  Objective: Temp:  [97.8 F (36.6 C)-98.4 F (36.9 C)] 97.9 F (36.6 C) (07/21 0331) Pulse Rate:  [58-75] 61 (07/21 0600) Resp:  [12-22] 14 (07/21 0600) BP: (113-137)/(57-86) 116/67 mmHg (07/21 0600) SpO2:  [93 %-98 %] 97 % (07/21 0600)   Physical Exam: Gen: No acute distress. alert, cooperative with exam HEENT: Atraumatic, Oropharynx clear. MMM CV: RRR. No murmurs, rubs, or gallops noted. Resp: CTAB. No wheezing, crackles, or rhonchi noted. No increased WOB Abd: +BS. Soft, non-distended, non-tender.  Neuro: Alert and oriented, No gross focal deficits, follows commands Laboratory:  Recent Labs Lab 05/23/15 2242  WBC 7.6  HGB 11.6*  HCT 34.7*  PLT 289    Recent Labs Lab 05/23/15 2242  NA 137  K 3.6  CL 102  CO2 26  BUN 18  CREATININE 0.85  CALCIUM 9.2  GLUCOSE 101*    Imaging/Diagnostic Tests:  Lauren Ochoa 05/25/2015, 6:56 AM PGY-1, [facility] FPTS Intern pager: [phone], text pages welcome  FMTS ATTENDING  NOTE Nicolette Bang I  have seen and examined this patient, reviewed their chart. I have discussed this patient with the resident. I agree with the resident's findings, assessment and care plan.

## 2015-05-26 ENCOUNTER — Ambulatory Visit (HOSPITAL_COMMUNITY): Payer: BLUE CROSS/BLUE SHIELD | Attending: Cardiology

## 2015-05-26 DIAGNOSIS — I200 Unstable angina: Secondary | ICD-10-CM | POA: Diagnosis not present

## 2015-05-26 LAB — MYOCARDIAL PERFUSION IMAGING
Estimated workload: 7 METS
Exercise duration (min): 6 min
Exercise duration (sec): 0 s
LV dias vol: 73 mL
LV sys vol: 18 mL
MPHR: 164 {beats}/min
Peak HR: 144 {beats}/min
Percent HR: 87 %
RATE: 0.31
Rest HR: 67 {beats}/min
SDS: 1
SRS: 3
SSS: 4
TID: 0.78

## 2015-05-26 MED ORDER — TECHNETIUM TC 99M SESTAMIBI GENERIC - CARDIOLITE
32.6000 | Freq: Once | INTRAVENOUS | Status: AC | PRN
  Administered 2015-05-26: 33 via INTRAVENOUS

## 2015-05-26 MED ORDER — TECHNETIUM TC 99M SESTAMIBI GENERIC - CARDIOLITE
10.7000 | Freq: Once | INTRAVENOUS | Status: AC | PRN
  Administered 2015-05-26: 11 via INTRAVENOUS

## 2021-07-08 ENCOUNTER — Emergency Department (HOSPITAL_COMMUNITY): Payer: 59

## 2021-07-08 ENCOUNTER — Emergency Department (HOSPITAL_COMMUNITY)
Admission: EM | Admit: 2021-07-08 | Discharge: 2021-07-09 | Disposition: A | Discharge status: Home / Self Care | Payer: 59 | Attending: Emergency Medicine | Admitting: Emergency Medicine

## 2021-07-08 ENCOUNTER — Other Ambulatory Visit: Payer: Self-pay

## 2021-07-08 ENCOUNTER — Encounter (HOSPITAL_COMMUNITY): Payer: Self-pay

## 2021-07-08 DIAGNOSIS — Z79899 Other long term (current) drug therapy: Secondary | ICD-10-CM | POA: Insufficient documentation

## 2021-07-08 DIAGNOSIS — R1011 Right upper quadrant pain: Secondary | ICD-10-CM | POA: Insufficient documentation

## 2021-07-08 DIAGNOSIS — E119 Type 2 diabetes mellitus without complications: Secondary | ICD-10-CM | POA: Diagnosis not present

## 2021-07-08 DIAGNOSIS — Z7984 Long term (current) use of oral hypoglycemic drugs: Secondary | ICD-10-CM | POA: Diagnosis not present

## 2021-07-08 DIAGNOSIS — N644 Mastodynia: Secondary | ICD-10-CM | POA: Insufficient documentation

## 2021-07-08 DIAGNOSIS — S161XXA Strain of muscle, fascia and tendon at neck level, initial encounter: Secondary | ICD-10-CM | POA: Diagnosis not present

## 2021-07-08 DIAGNOSIS — I10 Essential (primary) hypertension: Secondary | ICD-10-CM | POA: Insufficient documentation

## 2021-07-08 DIAGNOSIS — Z7982 Long term (current) use of aspirin: Secondary | ICD-10-CM | POA: Insufficient documentation

## 2021-07-08 DIAGNOSIS — M79621 Pain in right upper arm: Secondary | ICD-10-CM | POA: Diagnosis not present

## 2021-07-08 DIAGNOSIS — J45909 Unspecified asthma, uncomplicated: Secondary | ICD-10-CM | POA: Insufficient documentation

## 2021-07-08 DIAGNOSIS — M546 Pain in thoracic spine: Secondary | ICD-10-CM | POA: Insufficient documentation

## 2021-07-08 DIAGNOSIS — Y92410 Unspecified street and highway as the place of occurrence of the external cause: Secondary | ICD-10-CM | POA: Diagnosis not present

## 2021-07-08 DIAGNOSIS — S199XXA Unspecified injury of neck, initial encounter: Secondary | ICD-10-CM | POA: Diagnosis present

## 2021-07-08 LAB — CBC WITH DIFFERENTIAL/PLATELET
Abs Immature Granulocytes: 0.02 10*3/uL (ref 0.00–0.07)
Basophils Absolute: 0 10*3/uL (ref 0.0–0.1)
Basophils Relative: 1 %
Eosinophils Absolute: 0.2 10*3/uL (ref 0.0–0.5)
Eosinophils Relative: 2 %
HCT: 42 % (ref 36.0–46.0)
Hemoglobin: 14.1 g/dL (ref 12.0–15.0)
Immature Granulocytes: 0 %
Lymphocytes Relative: 38 %
Lymphs Abs: 2.9 10*3/uL (ref 0.7–4.0)
MCH: 29.9 pg (ref 26.0–34.0)
MCHC: 33.6 g/dL (ref 30.0–36.0)
MCV: 89 fL (ref 80.0–100.0)
Monocytes Absolute: 0.5 10*3/uL (ref 0.1–1.0)
Monocytes Relative: 6 %
Neutro Abs: 4.1 10*3/uL (ref 1.7–7.7)
Neutrophils Relative %: 53 %
Platelets: 330 10*3/uL (ref 150–400)
RBC: 4.72 MIL/uL (ref 3.87–5.11)
RDW: 12.8 % (ref 11.5–15.5)
WBC: 7.7 10*3/uL (ref 4.0–10.5)
nRBC: 0 % (ref 0.0–0.2)

## 2021-07-08 LAB — COMPREHENSIVE METABOLIC PANEL
ALT: 32 U/L (ref 0–44)
AST: 30 U/L (ref 15–41)
Albumin: 5 g/dL (ref 3.5–5.0)
Alkaline Phosphatase: 58 U/L (ref 38–126)
Anion gap: 12 (ref 5–15)
BUN: 14 mg/dL (ref 8–23)
CO2: 27 mmol/L (ref 22–32)
Calcium: 9.9 mg/dL (ref 8.9–10.3)
Chloride: 104 mmol/L (ref 98–111)
Creatinine, Ser: 0.84 mg/dL (ref 0.44–1.00)
GFR, Estimated: >60 mL/min (ref >60)
Glucose, Bld: 130 mg/dL — ABNORMAL HIGH (ref 70–99)
Potassium: 3.5 mmol/L (ref 3.5–5.1)
Sodium: 143 mmol/L (ref 135–145)
Total Bilirubin: 0.6 mg/dL (ref 0.3–1.2)
Total Protein: 8.6 g/dL — ABNORMAL HIGH (ref 6.5–8.1)

## 2021-07-08 MED ORDER — MORPHINE SULFATE (PF) 4 MG/ML IV SOLN
4.0000 mg | Freq: Once | INTRAVENOUS | Status: AC
  Administered 2021-07-08: 4 mg via INTRAVENOUS
  Filled 2021-07-08: qty 1

## 2021-07-08 MED ORDER — IOHEXOL 350 MG/ML SOLN
80.0000 mL | Freq: Once | INTRAVENOUS | Status: AC | PRN
  Administered 2021-07-08: 80 mL via INTRAVENOUS

## 2021-07-08 MED ORDER — ONDANSETRON HCL 4 MG/2ML IJ SOLN
4.0000 mg | Freq: Once | INTRAMUSCULAR | Status: AC
  Administered 2021-07-08: 4 mg via INTRAVENOUS
  Filled 2021-07-08: qty 2
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
## 2021-07-08 NOTE — ED Notes (Signed)
Patient transported to X-ray 

## 2021-07-09 MED ORDER — CYCLOBENZAPRINE HCL 10 MG PO TABS: 10.0000 mg | ORAL_TABLET | Freq: Two times a day (BID) | ORAL | 0 refills | Status: AC | PRN

## 2021-07-09 MED ORDER — MORPHINE SULFATE (PF) 4 MG/ML IV SOLN
4.0000 mg | Freq: Once | INTRAVENOUS | Status: AC
  Administered 2021-07-09: 4 mg via INTRAVENOUS
  Filled 2021-07-09: qty 1

## 2021-07-09 MED ORDER — HYDROCODONE-ACETAMINOPHEN 5-325 MG PO TABS: 1.0000 | ORAL_TABLET | ORAL | 0 refills | Status: AC | PRN

## 2021-07-11 NOTE — Progress Notes (Signed)
I spoke with Ms Lauren Ochoa regarding the referral [facility] received from [facility].  She resides in [place].  She has contacted her PCP, Harless Nakayama, PA for follow up care.  I faxed the ED records to [facility], [place] at [phone].

## 2021-11-12 ENCOUNTER — Other Ambulatory Visit: Payer: Self-pay | Admitting: Obstetrics and Gynecology

## 2021-11-12 DIAGNOSIS — Z1231 Encounter for screening mammogram for malignant neoplasm of breast: Secondary | ICD-10-CM

## 2021-11-13 ENCOUNTER — Ambulatory Visit
Admission: RE | Admit: 2021-11-13 | Discharge: 2021-11-13 | Disposition: A | Discharge status: Home / Self Care | Payer: BLUE CROSS/BLUE SHIELD | Source: Ambulatory Visit | Attending: Obstetrics and Gynecology | Admitting: Obstetrics and Gynecology

## 2021-11-13 DIAGNOSIS — Z1231 Encounter for screening mammogram for malignant neoplasm of breast: Secondary | ICD-10-CM

## 2022-04-18 ENCOUNTER — Other Ambulatory Visit: Payer: Self-pay | Admitting: Family Medicine

## 2022-04-18 DIAGNOSIS — M899 Disorder of bone, unspecified: Secondary | ICD-10-CM

## 2022-05-03 ENCOUNTER — Ambulatory Visit
Admission: RE | Admit: 2022-05-03 | Discharge: 2022-05-03 | Disposition: A | Discharge status: Home / Self Care | Payer: BLUE CROSS/BLUE SHIELD | Source: Ambulatory Visit | Attending: Family Medicine | Admitting: Family Medicine

## 2022-05-03 DIAGNOSIS — M899 Disorder of bone, unspecified: Secondary | ICD-10-CM

## 2022-05-03 MED ORDER — GADOBENATE DIMEGLUMINE 529 MG/ML IV SOLN
15.0000 mL | Freq: Once | INTRAVENOUS | Status: AC | PRN
  Administered 2022-05-03: 15 mL via INTRAVENOUS

## 2022-08-26 ENCOUNTER — Other Ambulatory Visit: Payer: Self-pay | Admitting: Family Medicine

## 2022-08-26 ENCOUNTER — Ambulatory Visit
Admission: RE | Admit: 2022-08-26 | Discharge: 2022-08-26 | Disposition: A | Discharge status: Home / Self Care | Payer: BLUE CROSS/BLUE SHIELD | Source: Ambulatory Visit | Attending: Family Medicine | Admitting: Family Medicine

## 2022-08-26 DIAGNOSIS — R052 Subacute cough: Secondary | ICD-10-CM

## 2022-12-19 DIAGNOSIS — M25531 Pain in right wrist: Secondary | ICD-10-CM | POA: Diagnosis not present

## 2022-12-19 DIAGNOSIS — Z0000 Encounter for general adult medical examination without abnormal findings: Secondary | ICD-10-CM | POA: Diagnosis not present

## 2022-12-19 DIAGNOSIS — E1136 Type 2 diabetes mellitus with diabetic cataract: Secondary | ICD-10-CM | POA: Diagnosis not present

## 2022-12-20 DIAGNOSIS — E1136 Type 2 diabetes mellitus with diabetic cataract: Secondary | ICD-10-CM | POA: Diagnosis not present

## 2022-12-20 DIAGNOSIS — E7800 Pure hypercholesterolemia, unspecified: Secondary | ICD-10-CM | POA: Diagnosis not present

## 2022-12-20 DIAGNOSIS — Z0000 Encounter for general adult medical examination without abnormal findings: Secondary | ICD-10-CM | POA: Diagnosis not present

## 2022-12-26 DIAGNOSIS — Z01419 Encounter for gynecological examination (general) (routine) without abnormal findings: Secondary | ICD-10-CM | POA: Diagnosis not present

## 2022-12-26 DIAGNOSIS — Z6833 Body mass index (BMI) 33.0-33.9, adult: Secondary | ICD-10-CM | POA: Diagnosis not present

## 2022-12-26 DIAGNOSIS — Z1231 Encounter for screening mammogram for malignant neoplasm of breast: Secondary | ICD-10-CM | POA: Diagnosis not present

## 2023-02-06 DIAGNOSIS — Z6835 Body mass index (BMI) 35.0-35.9, adult: Secondary | ICD-10-CM | POA: Diagnosis not present

## 2023-02-06 DIAGNOSIS — H66001 Acute suppurative otitis media without spontaneous rupture of ear drum, right ear: Secondary | ICD-10-CM | POA: Diagnosis not present

## 2023-02-06 DIAGNOSIS — R030 Elevated blood-pressure reading, without diagnosis of hypertension: Secondary | ICD-10-CM | POA: Diagnosis not present

## 2023-05-06 DIAGNOSIS — G4719 Other hypersomnia: Secondary | ICD-10-CM | POA: Diagnosis not present

## 2023-05-06 DIAGNOSIS — G4721 Circadian rhythm sleep disorder, delayed sleep phase type: Secondary | ICD-10-CM | POA: Diagnosis not present

## 2023-06-25 DIAGNOSIS — G4721 Circadian rhythm sleep disorder, delayed sleep phase type: Secondary | ICD-10-CM | POA: Diagnosis not present

## 2023-06-25 DIAGNOSIS — G4733 Obstructive sleep apnea (adult) (pediatric): Secondary | ICD-10-CM | POA: Diagnosis not present

## 2023-06-26 DIAGNOSIS — G4710 Hypersomnia, unspecified: Secondary | ICD-10-CM | POA: Diagnosis not present

## 2023-07-14 DIAGNOSIS — H25813 Combined forms of age-related cataract, bilateral: Secondary | ICD-10-CM | POA: Diagnosis not present

## 2023-07-24 DIAGNOSIS — H25812 Combined forms of age-related cataract, left eye: Secondary | ICD-10-CM | POA: Diagnosis not present

## 2023-07-28 DIAGNOSIS — R400 Somnolence: Secondary | ICD-10-CM | POA: Diagnosis not present

## 2023-07-28 DIAGNOSIS — G4733 Obstructive sleep apnea (adult) (pediatric): Secondary | ICD-10-CM | POA: Diagnosis not present

## 2023-08-21 DIAGNOSIS — H25811 Combined forms of age-related cataract, right eye: Secondary | ICD-10-CM | POA: Diagnosis not present

## 2023-08-27 DIAGNOSIS — R400 Somnolence: Secondary | ICD-10-CM | POA: Diagnosis not present

## 2023-08-27 DIAGNOSIS — G4733 Obstructive sleep apnea (adult) (pediatric): Secondary | ICD-10-CM | POA: Diagnosis not present

## 2023-09-09 DIAGNOSIS — H20042 Secondary noninfectious iridocyclitis, left eye: Secondary | ICD-10-CM | POA: Diagnosis not present

## 2023-09-16 DIAGNOSIS — H20042 Secondary noninfectious iridocyclitis, left eye: Secondary | ICD-10-CM | POA: Diagnosis not present

## 2023-10-22 DIAGNOSIS — H4312 Vitreous hemorrhage, left eye: Secondary | ICD-10-CM | POA: Diagnosis not present

## 2023-11-07 DIAGNOSIS — H3582 Retinal ischemia: Secondary | ICD-10-CM | POA: Diagnosis not present

## 2023-11-19 ENCOUNTER — Other Ambulatory Visit: Payer: Self-pay | Admitting: Family Medicine

## 2023-11-19 DIAGNOSIS — H3582 Retinal ischemia: Secondary | ICD-10-CM

## 2023-11-20 ENCOUNTER — Ambulatory Visit
Admission: RE | Admit: 2023-11-20 | Discharge: 2023-11-20 | Disposition: A | Discharge status: Home / Self Care | Payer: Medicare HMO | Source: Ambulatory Visit | Attending: Family Medicine | Admitting: Family Medicine

## 2023-11-20 DIAGNOSIS — I6522 Occlusion and stenosis of left carotid artery: Secondary | ICD-10-CM | POA: Diagnosis not present

## 2023-11-20 DIAGNOSIS — H3582 Retinal ischemia: Secondary | ICD-10-CM

## 2023-12-26 DIAGNOSIS — I10 Essential (primary) hypertension: Secondary | ICD-10-CM | POA: Diagnosis not present

## 2023-12-26 DIAGNOSIS — F321 Major depressive disorder, single episode, moderate: Secondary | ICD-10-CM | POA: Diagnosis not present

## 2023-12-26 DIAGNOSIS — E119 Type 2 diabetes mellitus without complications: Secondary | ICD-10-CM | POA: Diagnosis not present

## 2023-12-26 DIAGNOSIS — E113293 Type 2 diabetes mellitus with mild nonproliferative diabetic retinopathy without macular edema, bilateral: Secondary | ICD-10-CM | POA: Diagnosis not present

## 2023-12-26 DIAGNOSIS — Z79899 Other long term (current) drug therapy: Secondary | ICD-10-CM | POA: Diagnosis not present

## 2023-12-26 DIAGNOSIS — E2839 Other primary ovarian failure: Secondary | ICD-10-CM | POA: Diagnosis not present

## 2023-12-26 DIAGNOSIS — Z0000 Encounter for general adult medical examination without abnormal findings: Secondary | ICD-10-CM | POA: Diagnosis not present

## 2023-12-26 DIAGNOSIS — Z1211 Encounter for screening for malignant neoplasm of colon: Secondary | ICD-10-CM | POA: Diagnosis not present

## 2023-12-26 DIAGNOSIS — E7800 Pure hypercholesterolemia, unspecified: Secondary | ICD-10-CM | POA: Diagnosis not present

## 2023-12-26 DIAGNOSIS — E559 Vitamin D deficiency, unspecified: Secondary | ICD-10-CM | POA: Diagnosis not present

## 2023-12-26 DIAGNOSIS — Z23 Encounter for immunization: Secondary | ICD-10-CM | POA: Diagnosis not present

## 2023-12-28 DIAGNOSIS — R400 Somnolence: Secondary | ICD-10-CM | POA: Diagnosis not present

## 2023-12-28 DIAGNOSIS — G4733 Obstructive sleep apnea (adult) (pediatric): Secondary | ICD-10-CM | POA: Diagnosis not present

## 2024-01-05 DIAGNOSIS — W19XXXA Unspecified fall, initial encounter: Secondary | ICD-10-CM | POA: Diagnosis not present

## 2024-01-05 DIAGNOSIS — S6991XA Unspecified injury of right wrist, hand and finger(s), initial encounter: Secondary | ICD-10-CM | POA: Diagnosis not present

## 2024-01-15 DIAGNOSIS — Z1231 Encounter for screening mammogram for malignant neoplasm of breast: Secondary | ICD-10-CM | POA: Diagnosis not present

## 2024-01-15 DIAGNOSIS — Z01419 Encounter for gynecological examination (general) (routine) without abnormal findings: Secondary | ICD-10-CM | POA: Diagnosis not present

## 2024-02-22 ENCOUNTER — Emergency Department (HOSPITAL_COMMUNITY)
Admission: EM | Admit: 2024-02-22 | Discharge: 2024-02-23 | Disposition: A | Discharge status: Home / Self Care | Attending: Emergency Medicine | Admitting: Emergency Medicine

## 2024-02-22 ENCOUNTER — Encounter (HOSPITAL_COMMUNITY): Payer: Self-pay | Admitting: Emergency Medicine

## 2024-02-22 DIAGNOSIS — R42 Dizziness and giddiness: Secondary | ICD-10-CM | POA: Diagnosis not present

## 2024-02-22 DIAGNOSIS — E119 Type 2 diabetes mellitus without complications: Secondary | ICD-10-CM | POA: Diagnosis not present

## 2024-02-22 DIAGNOSIS — R040 Epistaxis: Secondary | ICD-10-CM | POA: Insufficient documentation

## 2024-02-22 DIAGNOSIS — J45909 Unspecified asthma, uncomplicated: Secondary | ICD-10-CM | POA: Insufficient documentation

## 2024-02-22 DIAGNOSIS — Z7984 Long term (current) use of oral hypoglycemic drugs: Secondary | ICD-10-CM | POA: Diagnosis not present

## 2024-02-22 DIAGNOSIS — R58 Hemorrhage, not elsewhere classified: Secondary | ICD-10-CM | POA: Diagnosis not present

## 2024-02-22 DIAGNOSIS — Z79899 Other long term (current) drug therapy: Secondary | ICD-10-CM | POA: Diagnosis not present

## 2024-02-22 DIAGNOSIS — I10 Essential (primary) hypertension: Secondary | ICD-10-CM | POA: Diagnosis not present

## 2024-02-22 DIAGNOSIS — Z7982 Long term (current) use of aspirin: Secondary | ICD-10-CM | POA: Diagnosis not present

## 2024-02-22 DIAGNOSIS — Z743 Need for continuous supervision: Secondary | ICD-10-CM | POA: Diagnosis not present

## 2024-02-22 LAB — BASIC METABOLIC PANEL WITH GFR
Anion gap: 13 (ref 5–15)
BUN: 20 mg/dL (ref 8–23)
CO2: 21 mmol/L — ABNORMAL LOW (ref 22–32)
Calcium: 8.9 mg/dL (ref 8.9–10.3)
Chloride: 99 mmol/L (ref 98–111)
Creatinine, Ser: 0.82 mg/dL (ref 0.44–1.00)
GFR, Estimated: >60 mL/min (ref >60)
Glucose, Bld: 199 mg/dL — ABNORMAL HIGH (ref 70–99)
Potassium: 3 mmol/L — ABNORMAL LOW (ref 3.5–5.1)
Sodium: 133 mmol/L — ABNORMAL LOW (ref 135–145)

## 2024-02-22 LAB — CBC WITH DIFFERENTIAL/PLATELET
Abs Immature Granulocytes: 0.04 10*3/uL (ref 0.00–0.07)
Basophils Absolute: 0.1 10*3/uL (ref 0.0–0.1)
Basophils Relative: 0 %
Eosinophils Absolute: 0.2 10*3/uL (ref 0.0–0.5)
Eosinophils Relative: 1 %
HCT: 33.4 % — ABNORMAL LOW (ref 36.0–46.0)
Hemoglobin: 11.9 g/dL — ABNORMAL LOW (ref 12.0–15.0)
Immature Granulocytes: 0 %
Lymphocytes Relative: 24 %
Lymphs Abs: 3.5 10*3/uL (ref 0.7–4.0)
MCH: 30 pg (ref 26.0–34.0)
MCHC: 35.6 g/dL (ref 30.0–36.0)
MCV: 84.1 fL (ref 80.0–100.0)
Monocytes Absolute: 0.7 10*3/uL (ref 0.1–1.0)
Monocytes Relative: 5 %
Neutro Abs: 9.7 10*3/uL — ABNORMAL HIGH (ref 1.7–7.7)
Neutrophils Relative %: 70 %
Platelets: 332 10*3/uL (ref 150–400)
RBC: 3.97 MIL/uL (ref 3.87–5.11)
RDW: 12.4 % (ref 11.5–15.5)
WBC: 14.1 10*3/uL — ABNORMAL HIGH (ref 4.0–10.5)
nRBC: 0 % (ref 0.0–0.2)

## 2024-02-22 LAB — PROTIME-INR
INR: 1.1 (ref 0.8–1.2)
Prothrombin Time: 14 s (ref 11.4–15.2)

## 2024-02-22 MED ORDER — TRANEXAMIC ACID 1000 MG/10ML IV SOLN
500.0000 mg | Freq: Once | INTRAVENOUS | Status: AC
  Administered 2024-02-22: 500 mg via TOPICAL

## 2024-02-22 MED ORDER — ONDANSETRON HCL 4 MG/2ML IJ SOLN
4.0000 mg | Freq: Once | INTRAMUSCULAR | Status: AC
  Administered 2024-02-22: 4 mg via INTRAVENOUS
  Filled 2024-02-22: qty 2

## 2024-02-22 MED ORDER — LIDOCAINE-EPINEPHRINE (PF) 2 %-1:200000 IJ SOLN
20.0000 mL | Freq: Once | INTRAMUSCULAR | Status: AC
  Administered 2024-02-22: 20 mL
  Filled 2024-02-22: qty 20

## 2024-02-22 MED ORDER — TRANEXAMIC ACID FOR EPISTAXIS
500.0000 mg | Freq: Once | TOPICAL | Status: DC
  Filled 2024-02-22: qty 10

## 2024-02-22 MED ORDER — LACTATED RINGERS IV BOLUS
1000.0000 mL | Freq: Once | INTRAVENOUS | Status: AC
  Administered 2024-02-22: 1000 mL via INTRAVENOUS

## 2024-02-22 NOTE — ED Provider Notes (Signed)
 [facility] Provider Note   CSN: [id number] Arrival date & time: 02/22/24  2226     History {Add pertinent medical, surgical, social history, OB history to HPI:1} Chief Complaint  Patient presents with   Epistaxis    Lauren Ochoa is a 68 y.o. female.  Patient with history of hypertension, diabetes, asthma here with epistaxis since 4 PM.  Reports spontaneously started while she was sitting on her sofa.  No trauma.  No history of previous nosebleeds.  Takes aspirin  but no other blood thinners.  Believes the blood is coming mostly from her right side but she is seen in both nostrils.  Trying to control at home with multiple rolls of paper towels and toilet paper.  Feels somewhat dizzy and lightheaded.  No chest pain or shortness of breath.  She is due for a colonoscopy tomorrow and has been taking the prep.  Had stool that was clear but is now dark.  Believes she has been swallowing a lot of blood.  Complains of lightheadedness and dizziness and nausea.  No chest pain or shortness of breath.  No blood thinner use.  The history is provided by the patient.  Epistaxis Associated symptoms: dizziness   Associated symptoms: no congestion and no cough        Home Medications Prior to Admission medications   Medication Sig Start Date End Date Taking? Authorizing Provider  amLODipine  (NORVASC ) 2.5 MG tablet Take 2.5 mg by mouth daily.    [provider]  aspirin  81 MG tablet Take 81 mg by mouth daily.    [provider]  atorvastatin  (LIPITOR) 20 MG tablet Take 20 mg by mouth daily.    [provider]  Calcium  Carbonate (CALCIUM  600 PO) Take 2 tablets by mouth daily.     [provider]  Coenzyme Q10 200 MG capsule Take 200 mg by mouth at bedtime.    [provider]  cyclobenzaprine  (FLEXERIL ) 10 MG tablet Take 1 tablet (10 mg total) by mouth 2 (two) times daily as needed for muscle spasms. 07/09/21    Angelyn Kennel M, PA-C  escitalopram  (LEXAPRO ) 10 MG tablet Take 20 mg by mouth daily.     [provider]  fish oil-omega-3 fatty acids 1000 MG capsule Take 2 g by mouth daily.    [provider]  glipiZIDE (GLUCOTROL XL) 2.5 MG 24 hr tablet Take 2.5 mg by mouth daily with breakfast. Take along with the 5mg  tablet to make a total of 7.5mg  daily per patient    [provider]  glipiZIDE (GLUCOTROL XL) 5 MG 24 hr tablet Take 5 mg by mouth daily with breakfast. Take along with the 2.5mg  tablet to make a total of 7.5mg  daily    [provider]  HYDROcodone -acetaminophen  (NORCO/VICODIN) 5-325 MG tablet Take 1 tablet by mouth every 4 (four) hours as needed. 07/09/21   Mandy Second, PA-C  lisinopril -hydrochlorothiazide  (PRINZIDE ,ZESTORETIC ) 20-25 MG per tablet Take 1 tablet by mouth daily.    [provider]  loratadine (CLARITIN) 10 MG tablet Take 10 mg by mouth daily.    [provider]  Magnesium 500 MG TABS Take 1 tablet by mouth daily.    [provider]  Multiple Vitamin (MULTIVITAMIN) capsule Take 1 capsule by mouth daily.    [provider]  Multiple Vitamins-Minerals (HAIR/SKIN/NAILS PO) Take 1 tablet by mouth daily.    [provider]  nitroGLYCERIN  (NITROSTAT ) 0.4 MG SL tablet Place  0.4 mg under the tongue every 5 (five) minutes as needed for chest pain.    [provider]  Probiotic Product (PROBIOTIC PO) Take 1 tablet by mouth daily.    [provider]  traZODone (DESYREL) 100 MG tablet Take 200 mg by mouth at bedtime.     [provider]      Allergies    Codeine and Percocet [oxycodone-acetaminophen ]    Review of Systems   Review of Systems  Constitutional:  Negative for activity change and appetite change.  HENT:  Positive for nosebleeds. Negative for congestion.   Respiratory:  Negative for cough, chest tightness and shortness of breath.   Gastrointestinal:  Negative for  abdominal pain, nausea and vomiting.  Genitourinary:  Negative for dysuria and hematuria.  Musculoskeletal:  Negative for arthralgias and myalgias.  Skin:  Negative for rash.  Neurological:  Positive for dizziness, weakness and light-headedness.   all other systems are negative except as noted in the HPI and PMH.    Physical Exam Updated Vital Signs BP 96/77 (BP Location: Right Arm)   Pulse 91   Temp 98.7 F (37.1 C) (Axillary)   Resp 17   Ht 5\' 1"  (1.549 m)   Wt 86 kg   SpO2 98%   BMI 35.82 kg/m  Physical Exam Vitals and nursing note reviewed.  Constitutional:      General: She is not in acute distress.    Appearance: She is well-developed.  HENT:     Head: Normocephalic and atraumatic.     Nose:     Comments: Oozing blood bilaterally, right greater than left, trace blood in posterior pharynx    Mouth/Throat:     Pharynx: No oropharyngeal exudate.  Eyes:     Conjunctiva/sclera: Conjunctivae normal.     Pupils: Pupils are equal, round, and reactive to light.  Neck:     Comments: No meningismus. Cardiovascular:     Rate and Rhythm: Normal rate and regular rhythm.     Heart sounds: Normal heart sounds. No murmur heard. Pulmonary:     Effort: Pulmonary effort is normal. No respiratory distress.     Breath sounds: Normal breath sounds.  Abdominal:     Palpations: Abdomen is soft.     Tenderness: There is no abdominal tenderness. There is no guarding or rebound.  Genitourinary:    Comments: Declines rectal exam Musculoskeletal:        General: No tenderness. Normal range of motion.     Cervical back: Normal range of motion and neck supple.  Skin:    General: Skin is warm.  Neurological:     Mental Status: She is alert and oriented to person, place, and time.     Cranial Nerves: No cranial nerve deficit.     Motor: No abnormal muscle tone.     Coordination: Coordination normal.     Comments:  5/5 strength throughout. CN 2-12 intact.Equal grip strength.    Psychiatric:        Behavior: Behavior normal.     ED Results / Procedures / Treatments   Labs (all labs ordered are listed, but only abnormal results are displayed) Labs Reviewed  CBC WITH DIFFERENTIAL/PLATELET  BASIC METABOLIC PANEL WITH GFR  PROTIME-INR  POC OCCULT BLOOD, ED  TYPE AND SCREEN    EKG None  Radiology No results found.  Procedures Epistaxis Management  Date/Time: 02/22/2024 11:22 PM  Performed by: Earma Gloss, MD Authorized by: Earma Gloss, MD   Consent:  Consent obtained:  Verbal   Consent given by:  Patient   Risks, benefits, and alternatives were discussed: yes     Risks discussed:  Bleeding   Alternatives discussed:  No treatment Anesthesia:    Anesthesia method:  Topical application   Topical anesthetic:  Lidocaine gel and epinephrine Procedure details:    Treatment site:  R anterior   Treatment complexity:  Extensive   Treatment episode: recurring   Post-procedure details:    Assessment:  Bleeding stopped   Procedure completion:  Tolerated   {Document cardiac monitor, telemetry assessment procedure when appropriate:1}  Medications Ordered in ED Medications  lidocaine-EPINEPHrine (XYLOCAINE W/EPI) 2 %-1:200000 (PF) injection 20 mL (has no administration in time range)  tranexamic acid  (CYKLOKAPRON ) injection 500 mg (has no administration in time range)    ED Course/ Medical Decision Making/ A&P   {   Click here for ABCD2, HEART and other calculatorsREFRESH Note before signing :1}                              Medical Decision Making Amount and/or Complexity of Data Reviewed Labs: ordered. Decision-making details documented in ED Course. Radiology: ordered and independent interpretation performed. Decision-making details documented in ED Course. ECG/medicine tests: ordered and independent interpretation performed. Decision-making details documented in ED Course.  Risk Prescription drug management.   NOsebleed since 4  PM.  Stable vitals.  No distress.  Oozing from the right side on arrival.  No blood thinner use other than aspirin .   Blood clots were evacuated on arrival.  Pressure held.  She is given topical TXA and lidocaine with epinephrine.  Patient declines rectal exam.  She is confident that her dark stool started after her nosebleed.  She is scheduled for colonoscopy tomorrow.   {Document critical care time when appropriate:1} {Document review of labs and clinical decision tools ie heart score, Chads2Vasc2 etc:1}  {Document your independent review of radiology images, and any outside records:1} {Document your discussion with family members, caretakers, and with consultants:1} {Document social determinants of health affecting pt's care:1} {Document your decision making why or why not admission, treatments were needed:1} Final Clinical Impression(s) / ED Diagnoses Final diagnoses:  None    Rx / DC Orders ED Discharge Orders     None

## 2024-02-22 NOTE — ED Triage Notes (Signed)
 Patient BIB EMS from home. Patient with nose bleed since 1600, bleeding from both nostrils.  Patient does not take thinners.  Patient was just sitting on her couch when it happened, it happened spontaneously. EMS did Afrin in each nostril, packed with gauze.   Patient started the prep for a colonoscopy tomorrow, and states that her stool was clear, and now it is black. She states she has been swallowing a lot of blood too.  Patient c/o dizziness, started after the bleeding.  Alert/oriented - BP 130/86

## 2024-02-23 ENCOUNTER — Telehealth (HOSPITAL_COMMUNITY): Payer: Self-pay | Admitting: Emergency Medicine

## 2024-02-23 LAB — TYPE AND SCREEN

## 2024-02-23 LAB — ABO/RH

## 2024-02-23 MED ORDER — POTASSIUM CHLORIDE CRYS ER 20 MEQ PO TBCR
40.0000 meq | EXTENDED_RELEASE_TABLET | Freq: Once | ORAL | Status: AC
  Administered 2024-02-23: 40 meq via ORAL
  Filled 2024-02-23: qty 2

## 2024-02-23 MED ORDER — OXYMETAZOLINE HCL 0.05 % NA SOLN: 1.0000 | Freq: Two times a day (BID) | NASAL | 0 refills | Status: DC

## 2024-02-23 MED ORDER — OXYMETAZOLINE HCL 0.05 % NA SOLN: 1.0000 | Freq: Two times a day (BID) | NASAL | 0 refills | Status: AC

## 2024-02-23 NOTE — Discharge Instructions (Addendum)
 You declined the packing of your nose today.  Keep pressure if bleeding recurs on hold for 30 minutes.  If still bleeding after that she should return to the ED.  Return to the ED sooner with difficulty breathing, chest pain, lightheadedness, or other concerns.

## 2024-02-23 NOTE — Telephone Encounter (Signed)
 Changing pharmacy per patient preference

## 2024-02-23 NOTE — ED Notes (Signed)
 Patient ambulated without difficulty.

## 2024-02-26 ENCOUNTER — Encounter (INDEPENDENT_AMBULATORY_CARE_PROVIDER_SITE_OTHER): Payer: Self-pay

## 2024-02-26 ENCOUNTER — Ambulatory Visit (INDEPENDENT_AMBULATORY_CARE_PROVIDER_SITE_OTHER): Admitting: Otolaryngology

## 2024-02-26 VITALS — BP 110/72 | Ht 61.0 in | Wt 182.0 lb

## 2024-02-26 DIAGNOSIS — R040 Epistaxis: Secondary | ICD-10-CM

## 2024-02-26 DIAGNOSIS — R058 Other specified cough: Secondary | ICD-10-CM | POA: Diagnosis not present

## 2024-02-26 DIAGNOSIS — Z9889 Other specified postprocedural states: Secondary | ICD-10-CM

## 2024-02-26 DIAGNOSIS — J3489 Other specified disorders of nose and nasal sinuses: Secondary | ICD-10-CM

## 2024-02-26 DIAGNOSIS — R0981 Nasal congestion: Secondary | ICD-10-CM

## 2024-02-26 DIAGNOSIS — R448 Other symptoms and signs involving general sensations and perceptions: Secondary | ICD-10-CM

## 2024-02-26 MED ORDER — MUPIROCIN 2 % EX OINT: 1.0000 | TOPICAL_OINTMENT | Freq: Two times a day (BID) | CUTANEOUS | 0 refills | Status: AC

## 2024-02-26 MED ORDER — AYR SALINE NASAL NA GEL: 1.0000 | NASAL | 0 refills | Status: AC | PRN

## 2024-02-26 NOTE — Patient Instructions (Addendum)
 Apply a pea sized amount of the mupirocin  ointment -- twice daily to nostril (not the septum) using pinkie finger twice per day for 10 days; then switch to the ayr gel Use afrin as needed Avoid blowing your nose for 10 days  Instructions to help prevent future episodes of nose bleeds and management: - Reduce nasal manipulation - no nose picking, no tissues in the nose other than for dabbing.  - Use Afrin nasal spray for ACTIVE bleeding on the bleeding side and hold firm, continuous nasal pressure (on nostril - soft part of the nose, not the bone)  for 15 mins continuously for ACTIVE bleeding.  - Frequent nasal saline spray (every 4-6 hours as needed) to keep nose moist. - Apply over-the-counter nasal saline GEL (such as Ayr GEL) to the nostrils (use pea sized amount on pinkie finger and put on the nostril - not the middle part of the septum) and then gently pinch the nostril. Do at least 3 times per day but can do it as many times as you want - May use humidifer in bedroom at night. epi

## 2024-02-26 NOTE — Progress Notes (Signed)
 Dear Dr. Candance Certain, Here is my assessment for our mutual patient, Lauren Ochoa. Thank you for allowing me the opportunity to care for your patient. Please do not hesitate to contact me should you have any other questions. Sincerely, Dr. Milon Aloe  Otolaryngology Clinic Note Referring provider: Dr. Candance Certain HPI:  Lauren Ochoa is a 68 y.o. female kindly referred by Dr. Candance Certain for evaluation of epistaxis.  Initial visit (02/2024): Epistaxis history: sudden onset right sided epistaxis on 02/22/2024, did not stop and thus went to ED where it was stopped with TXA. She reports that since then, she has been having bloody drainage from nose when she leans forward and some clots she is coughing. In addition, she reports that she was now having left sided intermittent epistaxis and she was quite worried and thus was added on urgently to clinic. She reports above and says she is not having right sided bleeding currently but left sided over past day or two. Low volume, stops on its own. Right side has not been an issue. She denies any history of epistaxis prior to this. Also reporting some dark stools. Some facial pressure on right and congestion.  HTN: yes Side: Right prior, now left CKD/Liver dysfunction: denies Anticoagulation/AP: prior ASA 81 but stopped Trauma: denies History of Sinusitis: yes, but none for past several years Nasal obstruction: denies Nasal procedures: Sinus surgery x2 (last one "30 years ago") Current nasal medication use: denies  H&N Surgery: see above Personal or FHx of bleeding dz or anesthesia difficulty: no  GLP-1: no AP/AC: no  PMHx: HTN, DM, Asthma  Independent Review of Additional Tests or Records:  ED Visit Dr. Alison Irvine (02/22/2024): Epistaxis on right, no CP or SOB; some lightheadedness; Stopped with TXA; Dx: Epistaxis; Rx: f/u ENT Labs 02/22/2024: PT/INR 14/1.1; BMP BUN/Cr 20/0.82; CBC WBC 14.1, Hgb 11.9 (baseline ~12); Plt 332 CTH 07/08/2021 independently interpreted  with respect to sinuses: bilateral maxillary antrostomy and partial ethmoidectomy noted, no significant sinonasal disease  PMH/Meds/All/SocHx/FamHx/ROS:   Past Medical History:  Diagnosis Date   Asthma    Depression    Diabetes mellitus without complication (HCC)    Hyperlipidemia    Hypertension      Past Surgical History:  Procedure Laterality Date   APPENDECTOMY     CESAREAN SECTION     knee scopes     NASAL SINUS SURGERY     URETER SURGERY      Family History  Problem Relation Age of Onset   Cancer Mother    Arthritis Sister    Arthritis Maternal Grandmother    Stroke Maternal Grandmother    Cancer Maternal Grandfather    Stroke Paternal Grandmother    Cancer Paternal Grandfather      Social Connections: Unknown (05/09/2021)   Received from [facility]   Social Connections    Frequency of Communication with Friends and Family: Not asked    Frequency of Social Gatherings with Friends and Family: Not asked      Current Outpatient Medications:    mupirocin  ointment (BACTROBAN ) 2 %, Apply 1 Application topically 2 (two) times daily. Apply a pea sized amount twice daily to nostril (not the septum) using pinkie finger twice per day, Disp: 22 g, Rfl: 0   saline (AYR) GEL, Place 1 Application into both nostrils every 4 (four) hours as needed. Apply a pea sized amount twice daily to nostril (not the septum) using pinkie finger up to 4-6 times per day, Disp: 14 g, Rfl: 0  amLODipine  (NORVASC ) 2.5 MG tablet, Take 2.5 mg by mouth daily., Disp: , Rfl:    aspirin  81 MG tablet, Take 81 mg by mouth daily., Disp: , Rfl:    atorvastatin  (LIPITOR) 20 MG tablet, Take 20 mg by mouth daily., Disp: , Rfl:    Calcium  Carbonate (CALCIUM  600 PO), Take 2 tablets by mouth daily. , Disp: , Rfl:    Coenzyme Q10 200 MG capsule, Take 200 mg by mouth at bedtime., Disp: , Rfl:    cyclobenzaprine  (FLEXERIL ) 10 MG tablet, Take 1 tablet (10 mg total) by mouth 2 (two) times daily as  needed for muscle spasms., Disp: 20 tablet, Rfl: 0   escitalopram  (LEXAPRO ) 10 MG tablet, Take 20 mg by mouth daily. , Disp: , Rfl:    fish oil-omega-3 fatty acids 1000 MG capsule, Take 2 g by mouth daily., Disp: , Rfl:    glipiZIDE (GLUCOTROL XL) 2.5 MG 24 hr tablet, Take 2.5 mg by mouth daily with breakfast. Take along with the 5mg  tablet to make a total of 7.5mg  daily per patient, Disp: , Rfl:    glipiZIDE (GLUCOTROL XL) 5 MG 24 hr tablet, Take 5 mg by mouth daily with breakfast. Take along with the 2.5mg  tablet to make a total of 7.5mg  daily, Disp: , Rfl:    HYDROcodone -acetaminophen  (NORCO/VICODIN) 5-325 MG tablet, Take 1 tablet by mouth every 4 (four) hours as needed., Disp: 12 tablet, Rfl: 0   lisinopril -hydrochlorothiazide  (PRINZIDE ,ZESTORETIC ) 20-25 MG per tablet, Take 1 tablet by mouth daily., Disp: , Rfl:    loratadine (CLARITIN) 10 MG tablet, Take 10 mg by mouth daily., Disp: , Rfl:    Magnesium 500 MG TABS, Take 1 tablet by mouth daily., Disp: , Rfl:    Multiple Vitamin (MULTIVITAMIN) capsule, Take 1 capsule by mouth daily., Disp: , Rfl:    Multiple Vitamins-Minerals (HAIR/SKIN/NAILS PO), Take 1 tablet by mouth daily., Disp: , Rfl:    nitroGLYCERIN  (NITROSTAT ) 0.4 MG SL tablet, Place 0.4 mg under the tongue every 5 (five) minutes as needed for chest pain., Disp: , Rfl:    oxymetazoline  (AFRIN NASAL SPRAY) 0.05 % nasal spray, Place 1 spray into both nostrils 2 (two) times daily for 3 days., Disp: 0.6 mL, Rfl: 0   Probiotic Product (PROBIOTIC PO), Take 1 tablet by mouth daily., Disp: , Rfl:    traZODone (DESYREL) 100 MG tablet, Take 200 mg by mouth at bedtime. , Disp: , Rfl:    Physical Exam:   There were no vitals taken for this visit.  Salient findings:  CN II-XII intact Bilateral EAC clear and TM intact with well pneumatized middle ear spaces Anterior rhinoscopy: Septum relatively midline; bilateral inferior turbinates without significant hypertrophy; no active epistaxis today;  right anterior septal crust, left with prominent vessel left mid-septum but unable to visualize it comprehensively; Nasal endoscopy was indicated to better evaluate the nose and paranasal sinuses, given the patient's history and exam findings, and is detailed below. No lesions of oral cavity/oropharynx; some dried blood posterior oropharynx No obviously palpable neck masses/lymphadenopathy/thyromegaly No respiratory distress or stridor  Seprately Identifiable Procedures:  Prior to initiating any procedures, risks/benefits/alternatives were explained to the patient and verbal consent obtained.  PROCEDURE: Bilateral Diagnostic Rigid Nasal Endoscopy with Right Debridement and Endoscopic Control of Epistaxis (Left) Pre-procedure diagnosis: Nasal congestion, prior functional endoscopic sinus surgery; Epistaxis Left Post-procedure diagnosis: same Indication: See pre-procedure diagnosis and physical exam above Complications: None apparent EBL: <5 mL Anesthesia: Lidocaine 4% and topical decongestant was topically sprayed  in each nasal cavity  Description of Procedure:  Patient was identified and verbal consent obtained. Subsequently, a headlight and speculum were used and vessels were noted over left anterior septum; there was a very adherent crust on the right, which was left undisturbed. I was unable to determine the posterior-most extent of vessels  on left using the headlight/speculum. Therefore, nasal endoscopy was necessary to evaluate and control the epistaxis. A rigid 30 degree endoscope was utilized to evaluate the sinonasal cavities, mucosa, sinus ostia and turbinates and septum.  Overall, signs of mucosal inflammation are noted on right. Also noted are right middle meatus crusting which was modest and some clot as well. On left, there was some left in the maxillary sinus which was suctioned. Noted post-surgical changes with bilateral maxillary antrostomy and partial ethmoidectomy. The clots were  evacuated from left max, and on the right, the obstructive crusts and clot were debrided with a 8 Fr suction. After debridement, sinus cavity patency was much improved. No adverse synechiae were noted.  We then addressed the epistaxis. On the left, there were prominent vessels over septum but otherwise no bleeding was noted. We discussed options including humidification, v/s cautery and R/B/A and patient/caregiver opted for cauterization. Given lack of complete visualization of the posterior portion of the vessel extent, decision was performed to perform cauterization after consent. Using the endoscope, the areas of bleeding were visualized and then spot cauterized using silver nitrate cautery.  No bleeding was seen. Mupirocin  ointment was applied to both nares. Patient tolerated the procedure well.  CPT CODE -- 16109 - Mod 25; CPT [id number]  Impression & Plans:  Lauren Ochoa is a 68 y.o. female with:  1. Epistaxis   2. S/P FESS (functional endoscopic sinus surgery)    Noted left epistaxis now after recent ED visit for RIGHT epistaxis. She reports that she is coughing up clots intermittently but now main concern is LEFT epistaxis. There was a very adherent crust on the right and since she was not having epistaxis from that side, we decided to not address that for now since her epistaxis is on LEFT. Left septum cauterized using silver nitrate. In addition, she also was having some facial pressure and congestion on right, with some crusting and clot. This was debrided and no active bleeding was noted and this was not the source of her bleeding.  We discussed epistaxis precautions Will do mupirocin  ointment BID x10d; AYR gel thereafter q4h PRN No nose blowing F/u 6 weeks, can potentially address right at that time  See below regarding exact medications prescribed this encounter including dosages and route: Meds ordered this encounter  Medications   mupirocin  ointment (BACTROBAN ) 2 %    Sig: Apply 1  Application topically 2 (two) times daily. Apply a pea sized amount twice daily to nostril (not the septum) using pinkie finger twice per day    Dispense:  22 g    Refill:  0   saline (AYR) GEL    Sig: Place 1 Application into both nostrils every 4 (four) hours as needed. Apply a pea sized amount twice daily to nostril (not the septum) using pinkie finger up to 4-6 times per day    Dispense:  14 g    Refill:  0      Thank you for allowing me the opportunity to care for your patient. Please do not hesitate to contact me should you have any other questions.  Sincerely, Milon Aloe, MD Otolaryngologist (ENT), [facility]  Phone: [phone] Fax: [phone]  02/26/2024, 2:35 PM   MDM:  Level 4 - 99204 Complexity/Problems addressed: mod Data complexity: mod - independent review of notes, labs, independent interpretation of testing - Morbidity: mod  - Prescription Drug prescribed or managed: yes

## 2024-02-27 DIAGNOSIS — E871 Hypo-osmolality and hyponatremia: Secondary | ICD-10-CM | POA: Diagnosis not present

## 2024-02-27 DIAGNOSIS — E876 Hypokalemia: Secondary | ICD-10-CM | POA: Diagnosis not present

## 2024-02-27 DIAGNOSIS — R040 Epistaxis: Secondary | ICD-10-CM | POA: Diagnosis not present

## 2024-03-04 DIAGNOSIS — Z860101 Personal history of adenomatous and serrated colon polyps: Secondary | ICD-10-CM | POA: Diagnosis not present

## 2024-03-04 DIAGNOSIS — D120 Benign neoplasm of cecum: Secondary | ICD-10-CM | POA: Diagnosis not present

## 2024-03-04 DIAGNOSIS — Z09 Encounter for follow-up examination after completed treatment for conditions other than malignant neoplasm: Secondary | ICD-10-CM | POA: Diagnosis not present

## 2024-03-04 DIAGNOSIS — D123 Benign neoplasm of transverse colon: Secondary | ICD-10-CM | POA: Diagnosis not present

## 2024-03-05 DIAGNOSIS — D649 Anemia, unspecified: Secondary | ICD-10-CM | POA: Diagnosis not present

## 2024-03-08 DIAGNOSIS — D123 Benign neoplasm of transverse colon: Secondary | ICD-10-CM | POA: Diagnosis not present

## 2024-03-08 DIAGNOSIS — D120 Benign neoplasm of cecum: Secondary | ICD-10-CM | POA: Diagnosis not present

## 2024-04-15 ENCOUNTER — Encounter (INDEPENDENT_AMBULATORY_CARE_PROVIDER_SITE_OTHER): Payer: Self-pay | Admitting: Otolaryngology

## 2024-04-15 ENCOUNTER — Ambulatory Visit (INDEPENDENT_AMBULATORY_CARE_PROVIDER_SITE_OTHER): Admitting: Otolaryngology

## 2024-04-15 VITALS — BP 116/74 | HR 96 | Ht 61.0 in | Wt 186.0 lb

## 2024-04-15 DIAGNOSIS — Z9889 Other specified postprocedural states: Secondary | ICD-10-CM

## 2024-04-15 DIAGNOSIS — R040 Epistaxis: Secondary | ICD-10-CM | POA: Diagnosis not present

## 2024-04-15 NOTE — Progress Notes (Signed)
 Dear Dr. Candance Certain, Here is my assessment for our mutual patient, Lauren Ochoa. Thank you for allowing me the opportunity to care for your patient. Please do not hesitate to contact me should you have any other questions. Sincerely, Dr. Milon Aloe  Otolaryngology Clinic Note Referring provider: Dr. Candance Certain HPI:  Lauren Ochoa is a 68 y.o. female kindly referred by Dr. Candance Certain for evaluation of epistaxis.  Initial visit (02/2024): Epistaxis history: sudden onset right sided epistaxis on 02/22/2024, did not stop and thus went to ED where it was stopped with TXA. She reports that since then, she has been having bloody drainage from nose when she leans forward and some clots she is coughing. In addition, she reports that she was now having left sided intermittent epistaxis and she was quite worried and thus was added on urgently to clinic. She reports above and says she is not having right sided bleeding currently but left sided over past day or two. Low volume, stops on its own. Right side has not been an issue. She denies any history of epistaxis prior to this. Also reporting some dark stools. Some facial pressure on right and congestion.  HTN: yes Side: Right prior, now left CKD/Liver dysfunction: denies Anticoagulation/AP: prior ASA 81 but stopped Trauma: denies History of Sinusitis: yes, but none for past several years Nasal obstruction: denies Nasal procedures: Sinus surgery x2 (last one 30 years ago) Current nasal medication use: denies  --------------------------------------------------------- 04/15/2024 Seen in follow up. Very happy with how she has done on left. No further epistaxis. Had an episode on right about a month ago which took 30 mins to stop. If possible, she would like to have this side cauterized to control right sided intermittent epistaxis.  H&N Surgery: see above Personal or FHx of bleeding dz or anesthesia difficulty: no  GLP-1: no AP/AC: no  PMHx: HTN, DM,  Asthma  Independent Review of Additional Tests or Records:  ED Visit Dr. Alison Irvine (02/22/2024): Epistaxis on right, no CP or SOB; some lightheadedness; Stopped with TXA; Dx: Epistaxis; Rx: f/u ENT Labs 02/22/2024: PT/INR 14/1.1; BMP BUN/Cr 20/0.82; CBC WBC 14.1, Hgb 11.9 (baseline ~12); Plt 332 CTH 07/08/2021 independently interpreted with respect to sinuses: bilateral maxillary antrostomy and partial ethmoidectomy noted, no significant sinonasal disease  PMH/Meds/All/SocHx/FamHx/ROS:   Past Medical History:  Diagnosis Date   Asthma    Depression    Diabetes mellitus without complication (HCC)    Hyperlipidemia    Hypertension      Past Surgical History:  Procedure Laterality Date   APPENDECTOMY     CESAREAN SECTION     knee scopes     NASAL SINUS SURGERY     URETER SURGERY      Family History  Problem Relation Age of Onset   Cancer Mother    Arthritis Sister    Arthritis Maternal Grandmother    Stroke Maternal Grandmother    Cancer Maternal Grandfather    Stroke Paternal Grandmother    Cancer Paternal Grandfather      Social Connections: Unknown (05/09/2021)   Received from [facility]   Social Connections    Frequency of Communication with Friends and Family: Not asked    Frequency of Social Gatherings with Friends and Family: Not asked      Current Outpatient Medications:    amLODipine  (NORVASC ) 2.5 MG tablet, Take 2.5 mg by mouth daily., Disp: , Rfl:    atorvastatin  (LIPITOR) 20 MG tablet, Take 20 mg by mouth daily., Disp: , Rfl:  Calcium  Carbonate (CALCIUM  600 PO), Take 2 tablets by mouth daily. , Disp: , Rfl:    cholecalciferol (VITAMIN D3) 25 MCG (1000 UNIT) tablet, Take 1,000 Units by mouth daily., Disp: , Rfl:    cyclobenzaprine  (FLEXERIL ) 10 MG tablet, Take 1 tablet (10 mg total) by mouth 2 (two) times daily as needed for muscle spasms., Disp: 20 tablet, Rfl: 0   escitalopram  (LEXAPRO ) 10 MG tablet, Take 20 mg by mouth daily. , Disp: , Rfl:    fish  oil-omega-3 fatty acids 1000 MG capsule, Take 2 g by mouth daily., Disp: , Rfl:    glipiZIDE (GLUCOTROL XL) 5 MG 24 hr tablet, Take 5 mg by mouth daily with breakfast. Take along with the 2.5mg  tablet to make a total of 7.5mg  daily, Disp: , Rfl:    insulin  lispro (HUMALOG) 100 UNIT/ML injection, Inject 1,000 Units into the skin once., Disp: , Rfl:    loratadine (CLARITIN) 10 MG tablet, Take 10 mg by mouth daily., Disp: , Rfl:    losartan-hydrochlorothiazide  (HYZAAR) 50-12.5 MG tablet, Take 1 tablet by mouth daily., Disp: , Rfl:    metFORMIN (GLUCOPHAGE) 1000 MG tablet, Take 1,000 mg by mouth 2 (two) times daily with a meal., Disp: , Rfl:    metoprolol  tartrate (LOPRESSOR ) 25 MG tablet, Take 25 mg by mouth 2 (two) times daily., Disp: , Rfl:    Multiple Vitamin (MULTIVITAMIN) capsule, Take 1 capsule by mouth daily., Disp: , Rfl:    Multiple Vitamins-Minerals (HAIR/SKIN/NAILS PO), Take 1 tablet by mouth daily., Disp: , Rfl:    nitroGLYCERIN  (NITROSTAT ) 0.4 MG SL tablet, Place 0.4 mg under the tongue every 5 (five) minutes as needed for chest pain., Disp: , Rfl:    saline (AYR) GEL, Place 1 Application into both nostrils every 4 (four) hours as needed. Apply a pea sized amount twice daily to nostril (not the septum) using pinkie finger up to 4-6 times per day, Disp: 14 g, Rfl: 0   traZODone (DESYREL) 100 MG tablet, Take 200 mg by mouth at bedtime. , Disp: , Rfl:    aspirin  81 MG tablet, Take 81 mg by mouth daily. (Patient not taking: Reported on 04/15/2024), Disp: , Rfl:    Coenzyme Q10 200 MG capsule, Take 200 mg by mouth at bedtime. (Patient not taking: Reported on 04/15/2024), Disp: , Rfl:    glipiZIDE (GLUCOTROL XL) 2.5 MG 24 hr tablet, Take 2.5 mg by mouth daily with breakfast. Take along with the 5mg  tablet to make a total of 7.5mg  daily per patient (Patient not taking: Reported on 04/15/2024), Disp: , Rfl:    HYDROcodone -acetaminophen  (NORCO/VICODIN) 5-325 MG tablet, Take 1 tablet by mouth every 4  (four) hours as needed. (Patient not taking: Reported on 04/15/2024), Disp: 12 tablet, Rfl: 0   lisinopril -hydrochlorothiazide  (PRINZIDE ,ZESTORETIC ) 20-25 MG per tablet, Take 1 tablet by mouth daily. (Patient not taking: Reported on 04/15/2024), Disp: , Rfl:    Magnesium 500 MG TABS, Take 1 tablet by mouth daily. (Patient not taking: Reported on 04/15/2024), Disp: , Rfl:    mupirocin  ointment (BACTROBAN ) 2 %, Apply 1 Application topically 2 (two) times daily. Apply a pea sized amount twice daily to nostril (not the septum) using pinkie finger twice per day (Patient not taking: Reported on 04/15/2024), Disp: 22 g, Rfl: 0   Probiotic Product (PROBIOTIC PO), Take 1 tablet by mouth daily. (Patient not taking: Reported on 04/15/2024), Disp: , Rfl:    Physical Exam:   BP 116/74 (BP Location: Left Arm, Patient  Position: Sitting, Cuff Size: Large)   Pulse 96   Ht 5' 1 (1.549 m)   Wt 186 lb (84.4 kg)   SpO2 94%   BMI 35.14 kg/m   Salient findings:  CN II-XII intact Anterior rhinoscopy: Septum relatively midline; bilateral inferior turbinates without significant hypertrophy; no active epistaxis today; right anterior septal crust with more posterior prominent septal vessel I cannot fully visualize; left septum well healed, no prominent vessels; Nasal endoscopy was indicated to better evaluate the nose and paranasal sinuses, given the patient's history and exam findings, and is detailed below. No lesions of oral cavity/oropharynx; no oropharynx blood No respiratory distress or stridor  Seprately Identifiable Procedures:  Prior to initiating any procedures, risks/benefits/alternatives were explained to the patient and verbal consent obtained.  PROCEDURE: Bilateral Diagnostic Rigid Nasal Endoscopy with Endoscopic Control of Epistaxis (Right) Pre-procedure diagnosis: Right epistaxis Post-procedure diagnosis: same Indication: See pre-procedure diagnosis and physical exam above Complications: None  apparent EBL: <5 mL Anesthesia: Lidocaine  4% and topical decongestant was topically sprayed in each nasal cavity  Description of Procedure:  Patient was identified and verbal consent obtained. I was unable to determine the posterior-most extent of vessels on right using the headlight/speculum. Therefore, nasal endoscopy was necessary to evaluate and control the epistaxis. A rigid 30 degree endoscope was utilized to evaluate the sinonasal cavities, mucosa, sinus ostia and turbinates and septum.  Overall, signs of mucosal inflammation are not noted.. Clear b/l MM and SE recesses today. Noted post-surgical changes with bilateral maxillary antrostomy and partial ethmoidectomy which are patent. On the left, there were no prominent vessels over septum but there were prominent vessels over right septum. We discussed options including humidification, v/s cautery and R/B/A and patient/caregiver opted for cauterization. Given lack of complete visualization of the posterior portion of the vessel extent on right, decision was performed to perform cauterization after consent. Using the endoscope, the areas of suspect bleeding on right septum were visualized and then spot cauterized using silver nitrate cautery.  No bleeding was seen. Mupirocin  ointment was applied to both nares. Patient tolerated the procedure well.  CPT 28413 - Mod 25  Impression & Plans:  Lauren Ochoa is a 68 y.o. female with:  1. Epistaxis   2. S/P FESS (functional endoscopic sinus surgery)    Left epistaxis resolved. Right intermittent. Right septum cauterized today using silver nitrate which patient tolerated well - after discussion of R/B/A.  We discussed epistaxis precautions Will re-start mupirocin  ointment BID x10d; AYR gel thereafter q4h PRN No nose blowing F/u as needed - should epistaxis not resolve  See below regarding exact medications prescribed this encounter including dosages and route: restart mupirocin  ointment 2% BID x10d  to right nare this time.     Thank you for allowing me the opportunity to care for your patient. Please do not hesitate to contact me should you have any other questions.  Sincerely, Milon Aloe, MD Otolaryngologist (ENT), [facility] Phone: [phone] Fax: [phone]  04/15/2024, 6:57 PM   MDM:  Level 4 - 99214 Complexity/Problems addressed: mod Data complexity: low - Morbidity: mod  - Prescription Drug prescribed or managed: y

## 2024-05-03 DIAGNOSIS — E7800 Pure hypercholesterolemia, unspecified: Secondary | ICD-10-CM | POA: Diagnosis not present

## 2024-05-03 DIAGNOSIS — K635 Polyp of colon: Secondary | ICD-10-CM | POA: Diagnosis not present

## 2024-05-03 DIAGNOSIS — E113293 Type 2 diabetes mellitus with mild nonproliferative diabetic retinopathy without macular edema, bilateral: Secondary | ICD-10-CM | POA: Diagnosis not present

## 2024-05-03 DIAGNOSIS — F321 Major depressive disorder, single episode, moderate: Secondary | ICD-10-CM | POA: Diagnosis not present

## 2024-05-03 DIAGNOSIS — F329 Major depressive disorder, single episode, unspecified: Secondary | ICD-10-CM | POA: Diagnosis not present

## 2024-05-19 DIAGNOSIS — D649 Anemia, unspecified: Secondary | ICD-10-CM | POA: Diagnosis not present

## 2024-06-03 DIAGNOSIS — F329 Major depressive disorder, single episode, unspecified: Secondary | ICD-10-CM | POA: Diagnosis not present

## 2024-06-03 DIAGNOSIS — E7800 Pure hypercholesterolemia, unspecified: Secondary | ICD-10-CM | POA: Diagnosis not present

## 2024-06-03 DIAGNOSIS — E113293 Type 2 diabetes mellitus with mild nonproliferative diabetic retinopathy without macular edema, bilateral: Secondary | ICD-10-CM | POA: Diagnosis not present

## 2024-06-03 DIAGNOSIS — F321 Major depressive disorder, single episode, moderate: Secondary | ICD-10-CM | POA: Diagnosis not present

## 2024-06-24 DIAGNOSIS — F321 Major depressive disorder, single episode, moderate: Secondary | ICD-10-CM | POA: Diagnosis not present

## 2024-06-24 DIAGNOSIS — E7800 Pure hypercholesterolemia, unspecified: Secondary | ICD-10-CM | POA: Diagnosis not present

## 2024-06-24 DIAGNOSIS — G4700 Insomnia, unspecified: Secondary | ICD-10-CM | POA: Diagnosis not present

## 2024-06-24 DIAGNOSIS — R109 Unspecified abdominal pain: Secondary | ICD-10-CM | POA: Diagnosis not present

## 2024-06-24 DIAGNOSIS — E113293 Type 2 diabetes mellitus with mild nonproliferative diabetic retinopathy without macular edema, bilateral: Secondary | ICD-10-CM | POA: Diagnosis not present

## 2024-06-24 DIAGNOSIS — Z79899 Other long term (current) drug therapy: Secondary | ICD-10-CM | POA: Diagnosis not present

## 2024-06-24 DIAGNOSIS — M792 Neuralgia and neuritis, unspecified: Secondary | ICD-10-CM | POA: Diagnosis not present

## 2024-06-28 ENCOUNTER — Other Ambulatory Visit: Payer: Self-pay | Admitting: Family Medicine

## 2024-06-28 DIAGNOSIS — R109 Unspecified abdominal pain: Secondary | ICD-10-CM

## 2024-06-29 ENCOUNTER — Ambulatory Visit
Admission: RE | Admit: 2024-06-29 | Discharge: 2024-06-29 | Disposition: A | Discharge status: Home / Self Care | Source: Ambulatory Visit | Attending: Family Medicine | Admitting: Family Medicine

## 2024-06-29 DIAGNOSIS — R109 Unspecified abdominal pain: Secondary | ICD-10-CM

## 2024-06-29 DIAGNOSIS — K5730 Diverticulosis of large intestine without perforation or abscess without bleeding: Secondary | ICD-10-CM | POA: Diagnosis not present

## 2024-07-04 DIAGNOSIS — F321 Major depressive disorder, single episode, moderate: Secondary | ICD-10-CM | POA: Diagnosis not present

## 2024-07-04 DIAGNOSIS — E7800 Pure hypercholesterolemia, unspecified: Secondary | ICD-10-CM | POA: Diagnosis not present

## 2024-07-04 DIAGNOSIS — F329 Major depressive disorder, single episode, unspecified: Secondary | ICD-10-CM | POA: Diagnosis not present

## 2024-07-04 DIAGNOSIS — E113293 Type 2 diabetes mellitus with mild nonproliferative diabetic retinopathy without macular edema, bilateral: Secondary | ICD-10-CM | POA: Diagnosis not present

## 2024-07-08 DIAGNOSIS — Z794 Long term (current) use of insulin: Secondary | ICD-10-CM | POA: Diagnosis not present

## 2024-07-08 DIAGNOSIS — D122 Benign neoplasm of ascending colon: Secondary | ICD-10-CM | POA: Diagnosis not present

## 2024-07-08 DIAGNOSIS — Z7984 Long term (current) use of oral hypoglycemic drugs: Secondary | ICD-10-CM | POA: Diagnosis not present

## 2024-07-08 DIAGNOSIS — J45909 Unspecified asthma, uncomplicated: Secondary | ICD-10-CM | POA: Diagnosis not present

## 2024-07-08 DIAGNOSIS — Z885 Allergy status to narcotic agent status: Secondary | ICD-10-CM | POA: Diagnosis not present

## 2024-07-08 DIAGNOSIS — I10 Essential (primary) hypertension: Secondary | ICD-10-CM | POA: Diagnosis not present

## 2024-07-08 DIAGNOSIS — K635 Polyp of colon: Secondary | ICD-10-CM | POA: Diagnosis not present

## 2024-07-08 DIAGNOSIS — Z79899 Other long term (current) drug therapy: Secondary | ICD-10-CM | POA: Diagnosis not present

## 2024-07-08 DIAGNOSIS — D126 Benign neoplasm of colon, unspecified: Secondary | ICD-10-CM | POA: Diagnosis not present

## 2024-07-08 DIAGNOSIS — E119 Type 2 diabetes mellitus without complications: Secondary | ICD-10-CM | POA: Diagnosis not present

## 2024-08-02 DIAGNOSIS — H524 Presbyopia: Secondary | ICD-10-CM | POA: Diagnosis not present

## 2024-08-03 DIAGNOSIS — E113293 Type 2 diabetes mellitus with mild nonproliferative diabetic retinopathy without macular edema, bilateral: Secondary | ICD-10-CM | POA: Diagnosis not present

## 2024-08-03 DIAGNOSIS — F329 Major depressive disorder, single episode, unspecified: Secondary | ICD-10-CM | POA: Diagnosis not present

## 2024-08-03 DIAGNOSIS — E7800 Pure hypercholesterolemia, unspecified: Secondary | ICD-10-CM | POA: Diagnosis not present

## 2024-08-03 DIAGNOSIS — F321 Major depressive disorder, single episode, moderate: Secondary | ICD-10-CM | POA: Diagnosis not present

## 2024-08-24 DIAGNOSIS — F321 Major depressive disorder, single episode, moderate: Secondary | ICD-10-CM | POA: Diagnosis not present

## 2024-08-24 DIAGNOSIS — E113293 Type 2 diabetes mellitus with mild nonproliferative diabetic retinopathy without macular edema, bilateral: Secondary | ICD-10-CM | POA: Diagnosis not present

## 2024-08-24 DIAGNOSIS — J209 Acute bronchitis, unspecified: Secondary | ICD-10-CM | POA: Diagnosis not present

## 2024-08-24 DIAGNOSIS — Z79899 Other long term (current) drug therapy: Secondary | ICD-10-CM | POA: Diagnosis not present

## 2024-09-03 DIAGNOSIS — E7800 Pure hypercholesterolemia, unspecified: Secondary | ICD-10-CM | POA: Diagnosis not present

## 2024-09-03 DIAGNOSIS — F321 Major depressive disorder, single episode, moderate: Secondary | ICD-10-CM | POA: Diagnosis not present

## 2024-09-03 DIAGNOSIS — E113293 Type 2 diabetes mellitus with mild nonproliferative diabetic retinopathy without macular edema, bilateral: Secondary | ICD-10-CM | POA: Diagnosis not present

## 2024-09-03 DIAGNOSIS — F329 Major depressive disorder, single episode, unspecified: Secondary | ICD-10-CM | POA: Diagnosis not present

## 2024-09-29 DIAGNOSIS — L03012 Cellulitis of left finger: Secondary | ICD-10-CM | POA: Diagnosis not present

## 2024-10-03 DIAGNOSIS — E113293 Type 2 diabetes mellitus with mild nonproliferative diabetic retinopathy without macular edema, bilateral: Secondary | ICD-10-CM | POA: Diagnosis not present

## 2024-10-03 DIAGNOSIS — E7800 Pure hypercholesterolemia, unspecified: Secondary | ICD-10-CM | POA: Diagnosis not present

## 2024-10-03 DIAGNOSIS — F321 Major depressive disorder, single episode, moderate: Secondary | ICD-10-CM | POA: Diagnosis not present

## 2024-10-03 DIAGNOSIS — F329 Major depressive disorder, single episode, unspecified: Secondary | ICD-10-CM | POA: Diagnosis not present
